# Patient Record
Sex: Female | Born: 1960 | Race: White | Hispanic: No | Marital: Married | Smoking: Current every day smoker
Health system: Southern US, Community
[De-identification: ages and names within clinical notes are randomized; demographics above are authoritative.]

## PROBLEM LIST (undated history)

## (undated) DIAGNOSIS — K589 Irritable bowel syndrome without diarrhea: Secondary | ICD-10-CM

## (undated) DIAGNOSIS — K859 Acute pancreatitis without necrosis or infection, unspecified: Secondary | ICD-10-CM

## (undated) DIAGNOSIS — F329 Major depressive disorder, single episode, unspecified: Secondary | ICD-10-CM

## (undated) DIAGNOSIS — K219 Gastro-esophageal reflux disease without esophagitis: Secondary | ICD-10-CM

## (undated) DIAGNOSIS — R1011 Right upper quadrant pain: Secondary | ICD-10-CM

## (undated) DIAGNOSIS — F32A Depression, unspecified: Secondary | ICD-10-CM

## (undated) DIAGNOSIS — R5383 Other fatigue: Secondary | ICD-10-CM

## (undated) DIAGNOSIS — R634 Abnormal weight loss: Secondary | ICD-10-CM

## (undated) DIAGNOSIS — Z780 Asymptomatic menopausal state: Secondary | ICD-10-CM

## (undated) DIAGNOSIS — I10 Essential (primary) hypertension: Secondary | ICD-10-CM

## (undated) DIAGNOSIS — E7800 Pure hypercholesterolemia, unspecified: Secondary | ICD-10-CM

## (undated) DIAGNOSIS — Z8669 Personal history of other diseases of the nervous system and sense organs: Secondary | ICD-10-CM

## (undated) DIAGNOSIS — R61 Generalized hyperhidrosis: Secondary | ICD-10-CM

## (undated) DIAGNOSIS — F419 Anxiety disorder, unspecified: Secondary | ICD-10-CM

## (undated) DIAGNOSIS — G4700 Insomnia, unspecified: Secondary | ICD-10-CM

## (undated) DIAGNOSIS — J219 Acute bronchiolitis, unspecified: Secondary | ICD-10-CM

## (undated) DIAGNOSIS — E785 Hyperlipidemia, unspecified: Secondary | ICD-10-CM

## (undated) DIAGNOSIS — D649 Anemia, unspecified: Secondary | ICD-10-CM

## (undated) DIAGNOSIS — R1013 Epigastric pain: Secondary | ICD-10-CM

## (undated) HISTORY — DX: Hyperlipidemia, unspecified: E78.5

## (undated) HISTORY — DX: Essential (primary) hypertension: I10

## (undated) HISTORY — PX: WISDOM TOOTH EXTRACTION: SHX21

## (undated) HISTORY — DX: Irritable bowel syndrome without diarrhea: K58.9

## (undated) HISTORY — DX: Major depressive disorder, single episode, unspecified: F32.9

## (undated) HISTORY — DX: Other fatigue: R53.83

## (undated) HISTORY — DX: Asymptomatic menopausal state: Z78.0

## (undated) HISTORY — DX: Right upper quadrant pain: R10.11

## (undated) HISTORY — DX: Epigastric pain: R10.13

## (undated) HISTORY — DX: Anemia, unspecified: D64.9

## (undated) HISTORY — DX: Insomnia, unspecified: G47.00

## (undated) HISTORY — DX: Depression, unspecified: F32.A

## (undated) HISTORY — DX: Anxiety disorder, unspecified: F41.9

## (undated) HISTORY — DX: Pure hypercholesterolemia, unspecified: E78.00

## (undated) HISTORY — DX: Gastro-esophageal reflux disease without esophagitis: K21.9

## (undated) HISTORY — DX: Abnormal weight loss: R63.4

## (undated) HISTORY — DX: Generalized hyperhidrosis: R61

## (undated) HISTORY — DX: Acute pancreatitis without necrosis or infection, unspecified: K85.90

## (undated) HISTORY — DX: Personal history of other diseases of the nervous system and sense organs: Z86.69

## (undated) HISTORY — DX: Acute bronchiolitis, unspecified: J21.9

---

## 2001-06-19 HISTORY — PX: PARTIAL HYSTERECTOMY: SHX80

## 2002-06-19 HISTORY — PX: PARTIAL HYSTERECTOMY: SHX80

## 2003-08-05 ENCOUNTER — Encounter: Admission: RE | Admit: 2003-08-05 | Discharge: 2003-08-05 | Payer: Self-pay | Admitting: Obstetrics and Gynecology

## 2009-01-27 ENCOUNTER — Encounter: Admission: RE | Admit: 2009-01-27 | Discharge: 2009-01-27 | Payer: Self-pay | Admitting: Family Medicine

## 2009-03-25 ENCOUNTER — Other Ambulatory Visit: Admission: RE | Admit: 2009-03-25 | Discharge: 2009-03-25 | Payer: Self-pay | Admitting: Family Medicine

## 2009-04-05 ENCOUNTER — Emergency Department (HOSPITAL_COMMUNITY): Admission: EM | Admit: 2009-04-05 | Discharge: 2009-04-05 | Payer: Self-pay | Admitting: Emergency Medicine

## 2010-06-19 HISTORY — PX: BACK SURGERY: SHX140

## 2010-07-11 ENCOUNTER — Encounter: Payer: Self-pay | Admitting: Family Medicine

## 2011-05-24 ENCOUNTER — Other Ambulatory Visit: Payer: Self-pay | Admitting: Neurosurgery

## 2011-05-24 DIAGNOSIS — M545 Low back pain: Secondary | ICD-10-CM

## 2011-05-26 ENCOUNTER — Ambulatory Visit
Admission: RE | Admit: 2011-05-26 | Discharge: 2011-05-26 | Disposition: A | Discharge status: Home / Self Care | Payer: 59 | Source: Ambulatory Visit | Attending: Neurosurgery | Admitting: Neurosurgery

## 2011-05-26 DIAGNOSIS — M545 Low back pain: Secondary | ICD-10-CM

## 2011-05-26 MED ORDER — GADOBENATE DIMEGLUMINE 529 MG/ML IV SOLN
14.0000 mL | Freq: Once | INTRAVENOUS | Status: AC | PRN
  Administered 2011-05-26: 14 mL via INTRAVENOUS

## 2011-05-29 ENCOUNTER — Other Ambulatory Visit: Payer: Self-pay | Admitting: Neurosurgery

## 2011-05-29 DIAGNOSIS — M5410 Radiculopathy, site unspecified: Secondary | ICD-10-CM

## 2011-06-02 ENCOUNTER — Ambulatory Visit
Admission: RE | Admit: 2011-06-02 | Discharge: 2011-06-02 | Disposition: A | Discharge status: Home / Self Care | Payer: 59 | Source: Ambulatory Visit | Attending: Neurosurgery | Admitting: Neurosurgery

## 2011-06-02 DIAGNOSIS — M5410 Radiculopathy, site unspecified: Secondary | ICD-10-CM

## 2011-06-02 MED ORDER — METHYLPREDNISOLONE ACETATE 40 MG/ML INJ SUSP (RADIOLOG
120.0000 mg | Freq: Once | INTRAMUSCULAR | Status: AC
  Administered 2011-06-02: 120 mg via EPIDURAL

## 2011-06-02 MED ORDER — IOHEXOL 180 MG/ML  SOLN
1.0000 mL | Freq: Once | INTRAMUSCULAR | Status: AC | PRN
  Administered 2011-06-02: 1 mL via EPIDURAL

## 2011-06-02 NOTE — Patient Instructions (Signed)

## 2011-06-14 ENCOUNTER — Other Ambulatory Visit: Payer: Self-pay | Admitting: Neurosurgery

## 2011-06-14 DIAGNOSIS — M5410 Radiculopathy, site unspecified: Secondary | ICD-10-CM

## 2011-06-30 ENCOUNTER — Inpatient Hospital Stay: Admission: RE | Admit: 2011-06-30 | Payer: 59 | Source: Ambulatory Visit

## 2011-07-01 ENCOUNTER — Encounter (HOSPITAL_BASED_OUTPATIENT_CLINIC_OR_DEPARTMENT_OTHER): Payer: Self-pay | Admitting: *Deleted

## 2011-07-01 ENCOUNTER — Emergency Department (HOSPITAL_BASED_OUTPATIENT_CLINIC_OR_DEPARTMENT_OTHER)
Admission: EM | Admit: 2011-07-01 | Discharge: 2011-07-01 | Disposition: A | Discharge status: Home / Self Care | Payer: 59 | Attending: Emergency Medicine | Admitting: Emergency Medicine

## 2011-07-01 DIAGNOSIS — Y9229 Other specified public building as the place of occurrence of the external cause: Secondary | ICD-10-CM | POA: Insufficient documentation

## 2011-07-01 DIAGNOSIS — F17200 Nicotine dependence, unspecified, uncomplicated: Secondary | ICD-10-CM | POA: Insufficient documentation

## 2011-07-01 DIAGNOSIS — S0990XA Unspecified injury of head, initial encounter: Secondary | ICD-10-CM | POA: Insufficient documentation

## 2011-07-01 DIAGNOSIS — W1809XA Striking against other object with subsequent fall, initial encounter: Secondary | ICD-10-CM | POA: Insufficient documentation

## 2011-07-01 MED ORDER — OXYCODONE-ACETAMINOPHEN 5-325 MG PO TABS: 2.0000 | ORAL_TABLET | ORAL | Status: AC | PRN

## 2011-07-01 NOTE — ED Provider Notes (Signed)
History     CSN: [id number]  Arrival date & time 07/01/11  1557   First MD Initiated Contact with Patient 07/01/11 1752      Chief Complaint  Patient presents with  . Fall    (Consider location/radiation/quality/duration/timing/severity/associated sxs/prior treatment) HPI Comments: Pt states that she fell a week ago and hit her head on a table at a restaurant:pt states that she was seen that day and given hydrocodone:pt was then seen by her pcp this week and given ibuprofen and a head ct which was normal:pt states that the only thing that helps when she has pain like this is percocet  Patient is a 51 y.o. female presenting with head injury. The history is provided by the patient. No language interpreter was used.  Head Injury  The incident occurred more than 2 days ago. She came to the ER via walk-in. The injury mechanism was a direct blow. There was no loss of consciousness. There was no blood loss. The quality of the pain is described as throbbing. The pain is moderate. The pain has been constant since the injury. Pertinent negatives include no numbness, no blurred vision, no vomiting, no disorientation, no weakness and no memory loss.    History reviewed. No pertinent past medical history.  Past Surgical History  Procedure Date  . Back surgery   . Abdominal hysterectomy     History reviewed. No pertinent family history.  History  Substance Use Topics  . Smoking status: Current Everyday Smoker  . Smokeless tobacco: Not on file  . Alcohol Use: No    OB History    Grav Para Term Preterm Abortions TAB SAB Ect Mult Living                  Review of Systems  Eyes: Negative for blurred vision.  Gastrointestinal: Negative for vomiting.  Neurological: Negative for weakness and numbness.  Psychiatric/Behavioral: Negative for memory loss.  All other systems reviewed and are negative.    Allergies  Iohexol  Home Medications   Current Outpatient Rx  Name Route Sig  Dispense Refill  . GOODY HEADACHE PO Oral Take 1 packet by mouth 2 (two) times daily as needed. For headache    . IBUPROFEN 800 MG PO TABS Oral Take 800 mg by mouth every 8 (eight) hours as needed. For headache    . OXYCODONE-ACETAMINOPHEN 5-325 MG PO TABS Oral Take 2 tablets by mouth every 4 (four) hours as needed for pain. 6 tablet 0    BP 144/101  Pulse 70  Temp(Src) 98.3 F (36.8 C) (Oral)  Resp 20  Ht 5\' 4"  (1.626 m)  Wt 158 lb (71.668 kg)  BMI 27.12 kg/m2  SpO2 100%  LMP 07/01/2011  Physical Exam  Nursing note and vitals reviewed. Constitutional: She is oriented to person, place, and time. She appears well-developed and well-nourished.  HENT:  Head: Normocephalic and atraumatic.  Eyes: Conjunctivae are normal. Pupils are equal, round, and reactive to light.  Neck: Normal range of motion. Neck supple.  Cardiovascular: Normal rate and regular rhythm.   Pulmonary/Chest: Effort normal and breath sounds normal.  Abdominal: Soft. Bowel sounds are normal.  Musculoskeletal: Normal range of motion.  Neurological: She is alert and oriented to person, place, and time.  Skin: Skin is warm and dry.  Psychiatric: She has a normal mood and affect.    ED Course  Procedures (including critical care time)  Labs Reviewed - No data to display No results found.  1. Head injury       MDM  Pt was extremely agitated on exam when we were discussing pain management:pt states that she doesn't understand why I can just give percocet when she know that will work        Teressa Lower, NP 07/01/11 2046

## 2011-07-01 NOTE — ED Notes (Signed)
Pt states she fell about a week ago and hit her head. Seen at ED Sunday. Sent home with Vicodin. Saw family Dr. On [place]. Given Motrin. CT yesterday at [facility]. Has had H/A , N,V since. PERL.

## 2011-07-02 NOTE — ED Provider Notes (Signed)
Medical screening examination/treatment/procedure(s) were performed by non-physician practitioner and as supervising physician I was immediately available for consultation/collaboration.  Connery Shiffler, MD 07/02/11 0003 

## 2011-07-07 ENCOUNTER — Other Ambulatory Visit: Payer: 59

## 2011-07-14 ENCOUNTER — Ambulatory Visit
Admission: RE | Admit: 2011-07-14 | Discharge: 2011-07-14 | Disposition: A | Discharge status: Home / Self Care | Payer: 59 | Source: Ambulatory Visit | Attending: Neurosurgery | Admitting: Neurosurgery

## 2011-07-14 DIAGNOSIS — M5410 Radiculopathy, site unspecified: Secondary | ICD-10-CM

## 2011-07-14 MED ORDER — METHYLPREDNISOLONE ACETATE 40 MG/ML INJ SUSP (RADIOLOG
120.0000 mg | Freq: Once | INTRAMUSCULAR | Status: AC
  Administered 2011-07-14: 120 mg via EPIDURAL

## 2011-07-14 MED ORDER — IOHEXOL 180 MG/ML  SOLN
1.0000 mL | Freq: Once | INTRAMUSCULAR | Status: AC | PRN
  Administered 2011-07-14: 1 mL via EPIDURAL

## 2011-07-14 NOTE — Progress Notes (Signed)
Patient stated she pre-medicated today with Benadryl 50mg  PO for contrast allergy.  jkl

## 2011-07-20 DIAGNOSIS — M5414 Radiculopathy, thoracic region: Secondary | ICD-10-CM | POA: Insufficient documentation

## 2011-07-20 DIAGNOSIS — M5417 Radiculopathy, lumbosacral region: Secondary | ICD-10-CM

## 2011-08-02 ENCOUNTER — Other Ambulatory Visit: Payer: Self-pay | Admitting: Neurosurgery

## 2011-08-02 DIAGNOSIS — M4800 Spinal stenosis, site unspecified: Secondary | ICD-10-CM

## 2011-12-18 HISTORY — PX: LUMBAR FUSION: SHX111

## 2012-06-19 HISTORY — PX: COLONOSCOPY: SHX174

## 2013-02-26 ENCOUNTER — Other Ambulatory Visit: Payer: Self-pay | Admitting: Orthopaedic Surgery

## 2013-02-26 DIAGNOSIS — M545 Low back pain: Secondary | ICD-10-CM

## 2013-03-07 ENCOUNTER — Inpatient Hospital Stay: Admission: RE | Admit: 2013-03-07 | Payer: 59 | Source: Ambulatory Visit

## 2013-03-07 ENCOUNTER — Inpatient Hospital Stay
Admission: RE | Admit: 2013-03-07 | Discharge: 2013-03-07 | Disposition: A | Discharge status: Home / Self Care | Payer: 59 | Source: Ambulatory Visit | Attending: Orthopaedic Surgery | Admitting: Orthopaedic Surgery

## 2014-02-20 DIAGNOSIS — S46219A Strain of muscle, fascia and tendon of other parts of biceps, unspecified arm, initial encounter: Secondary | ICD-10-CM | POA: Insufficient documentation

## 2014-02-20 DIAGNOSIS — M546 Pain in thoracic spine: Secondary | ICD-10-CM | POA: Insufficient documentation

## 2014-03-21 ENCOUNTER — Ambulatory Visit (INDEPENDENT_AMBULATORY_CARE_PROVIDER_SITE_OTHER): Payer: No Typology Code available for payment source | Admitting: Family Medicine

## 2014-03-21 VITALS — BP 160/92 | HR 75 | Temp 97.8°F | Resp 18 | Ht 64.5 in | Wt 175.6 lb

## 2014-03-21 DIAGNOSIS — Z23 Encounter for immunization: Secondary | ICD-10-CM

## 2014-03-21 DIAGNOSIS — M79642 Pain in left hand: Secondary | ICD-10-CM

## 2014-03-21 DIAGNOSIS — S71102A Unspecified open wound, left thigh, initial encounter: Secondary | ICD-10-CM

## 2014-03-21 DIAGNOSIS — S61402A Unspecified open wound of left hand, initial encounter: Secondary | ICD-10-CM

## 2014-03-21 DIAGNOSIS — S71002A Unspecified open wound, left hip, initial encounter: Secondary | ICD-10-CM

## 2014-03-21 MED ORDER — OXYCODONE-ACETAMINOPHEN 5-325 MG PO TABS
1.0000 | ORAL_TABLET | Freq: Four times a day (QID) | ORAL | Status: DC | PRN
Start: 2014-03-21 — End: 2014-03-30

## 2014-03-21 MED ORDER — TETANUS-DIPHTH-ACELL PERTUSSIS 5-2.5-18.5 LF-MCG/0.5 IM SUSP: 0.5000 mL | Freq: Once | INTRAMUSCULAR | Status: DC

## 2014-03-21 NOTE — Patient Instructions (Signed)

## 2014-03-21 NOTE — Progress Notes (Signed)
VCO. Local anesthesia with 2cc of 1% lidocaine plain. Scrubbed with soap and water. Explored, no deep structure damage observed. Wound closed with #4 5-0 prolene simple interrupted sutures. Cleansed and dressed.

## 2014-03-21 NOTE — Progress Notes (Signed)
Subjective:  This chart was scribed for Amber SidleKurt Jailon Schaible, MD by Carl Bestelina Holson, Medical Scribe. This patient was seen in Room 6 and the patient's care was started at 3:12 PM.   Patient ID: Amber Noble, female    DOB: [birth year], 53 y.o.   MRN: [id number]  HPI HPI Comments: Amber Kunita Dann is a 53 y.o. female who presents to the [facility] complaining of a painful laceration to her right hand with associated numbness and tingling in her right fingers that she incurred today.  She states that the pain radiates to her wrist.  She is not sure if her Tdap is UTD.  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     There are no active problems to display for this patient.  Past Medical History  Diagnosis Date  . Hypertension   . Hyperlipidemia   . Depression   . GERD (gastroesophageal reflux disease)    Past Surgical History  Procedure Laterality Date  . Back surgery    . Abdominal hysterectomy     Allergies  Allergen Reactions  . Contrast Media [Iodinated Diagnostic Agents] Hives and Swelling    Throat  13 hour prep  . Vicodin [Hydrocodone-Acetaminophen] Itching  . Iohexol Rash    Desc: PREMED BENADRYL    Prior to Admission medications   Medication Sig Start Date End Date Taking? Authorizing Provider  ALPRAZolam Prudy Feeler(XANAX) 1 MG tablet Take 1 mg by mouth as needed for anxiety.   Yes Historical Provider, MD  hydrochlorothiazide (HYDRODIURIL) 25 MG tablet Take 25 mg by mouth daily.   Yes Historical Provider, MD  omeprazole (PRILOSEC) 40 MG capsule Take 40 mg by mouth daily.   Yes Historical Provider, MD  PARoxetine (PAXIL) 40 MG tablet Take 40 mg by mouth every morning.   Yes Historical Provider, MD  pravastatin (PRAVACHOL) 40 MG tablet Take 40 mg by mouth daily.   Yes Historical Provider, MD   History   Social History  . Marital Status: Married    Spouse Name: N/A    Number of Children: N/A  . Years of Education: N/A   Occupational History  . Not on  file.   Social History Main Topics  . Smoking status: Current Every Day Smoker  . Smokeless tobacco: Not on file  . Alcohol Use: No  . Drug Use: No  . Sexual Activity: Not on file   Other Topics Concern  . Not on file   Social History Narrative  . No narrative on file     Review of Systems  Musculoskeletal: Positive for arthralgias.  Skin: Positive for wound.  Neurological: Positive for numbness.     Objective:  Physical Exam  Nursing note and vitals reviewed. Constitutional: She is oriented to person, place, and time. She appears well-developed and well-nourished.  HENT:  Head: Normocephalic and atraumatic.  Eyes: EOM are normal.  Neck: Normal range of motion.  Cardiovascular: Normal rate.   Pulmonary/Chest: Effort normal.  Musculoskeletal: Normal range of motion.  Neurological: She is alert and oriented to person, place, and time.  Skin: Skin is warm and dry.  Full thickness 1.5 cm laceration over the MCP joint of her left index finger.  Psychiatric: She has a normal mood and affect. Her behavior is normal.   Numbness and tingling in fingers had resolved by the time patient left the office.  BP 160/92  Pulse 75  Temp(Src) 97.8 F (36.6 C) (Oral)  Resp 18  Ht 5' 4.5" (  1.638 m)  Wt 175 lb 9.6 oz (79.652 kg)  BMI 29.69 kg/m2  SpO2 98%  LMP 07/01/2011 Assessment & Plan:  I personally performed the services described in this documentation, which was scribed in my presence. The recorded information has been reviewed and is accurate. Please see note by physician assistant Wound, open, hand with or without fingers, left, initial encounter - Plan: Tdap (BOOSTRIX) injection 0.5 mL, oxyCODONE-acetaminophen (ROXICET) 5-325 MG per tablet  Pain of left hand - Plan: oxyCODONE-acetaminophen (ROXICET) 5-325 MG per tablet  Signed, Amber Sidle, MD

## 2014-03-24 ENCOUNTER — Telehealth: Payer: Self-pay

## 2014-03-24 NOTE — Telephone Encounter (Signed)
Patient states pharmacy filled hydrocodone as a 10 day supply. States she is running out of the medication and is still in pain. Please return call and advise. Thank you. CB [phone]

## 2014-03-24 NOTE — Telephone Encounter (Signed)
Pt has 3 tablets of Oxycodone left. Says she's been taking them q 6 hrs because she has to use her hand constantly at work and it's very painful. Please advise.

## 2014-03-25 NOTE — Telephone Encounter (Signed)
I cannot refill the oxycodone

## 2014-03-25 NOTE — Telephone Encounter (Signed)
Advised pt to RTC 

## 2014-03-30 ENCOUNTER — Ambulatory Visit (INDEPENDENT_AMBULATORY_CARE_PROVIDER_SITE_OTHER): Payer: No Typology Code available for payment source

## 2014-03-30 ENCOUNTER — Ambulatory Visit (INDEPENDENT_AMBULATORY_CARE_PROVIDER_SITE_OTHER): Payer: No Typology Code available for payment source | Admitting: Internal Medicine

## 2014-03-30 VITALS — BP 172/98 | HR 76 | Temp 99.0°F | Resp 16

## 2014-03-30 DIAGNOSIS — M79642 Pain in left hand: Secondary | ICD-10-CM

## 2014-03-30 DIAGNOSIS — S61402D Unspecified open wound of left hand, subsequent encounter: Secondary | ICD-10-CM

## 2014-03-30 MED ORDER — OXYCODONE-ACETAMINOPHEN 5-325 MG PO TABS: 1.0000 | ORAL_TABLET | Freq: Four times a day (QID) | ORAL | Status: DC | PRN

## 2014-03-30 NOTE — Progress Notes (Signed)
   Subjective:    Patient ID: Amber Noble, female    DOB: Oct 24, 1960, 53 y.o.   MRN: [id number]   PCP: No primary provider on file.  Chief Complaint  Patient presents with  . Suture / Staple Removal    Left hand, continued pain and numbness in hand    Medications, allergies, past medical history, surgical history, family history, social history and problem list reviewed and updated.  HPI  Presents for suture removal from wound of the LEFT hand, closed 03/21/2014. That note is reviewed.  The wound is healing well, and she's had no problems with it, but continues to have terrible pain in the hand that radiates up the forearm and tingling/numbness in all the fingers of the LEFT hand. The pain wakes her from sleep. The paresthesias are on the dorsal aspect of the proximal fingers, only extending the length of the index finger.  She has some swelling of both hands, such that she is unable to remove her rings. The Percocet she was given worked, but she has run out.  RIGHT hand dominant. 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 No fever, chills. No weakness in the hand/fingers.  Review of Systems As above.    Objective:   Physical Exam  Constitutional: She is oriented to person, place, and time. She appears well-developed and well-nourished. She is active and cooperative. No distress.  BP 172/98  Pulse 76  Temp(Src) 99 F (37.2 C) (Oral)  Resp 16  SpO2 100%  LMP 07/01/2011   Eyes: Conjunctivae are normal.  Pulmonary/Chest: Effort normal.  Musculoskeletal:       Left wrist: She exhibits normal range of motion, no tenderness, no bony tenderness and no swelling.       Left hand: She exhibits tenderness (mild, diffuse tenderness of the hand and forearm) and swelling (mild, diffuse swelling of both hands, L>R). She exhibits normal range of motion, no bony tenderness, normal capillary refill  and no deformity. Normal sensation noted. Normal strength noted.       Hands: Phalen's and Tinel's without exacerbation of symptoms.  Neurological: She is alert and oriented to person, place, and time. She has normal strength. No sensory deficit.  Skin: Skin is warm and dry. Laceration (well-healed, without erythema, induration, drainage) noted.  Psychiatric: She has a normal mood and affect. Her speech is normal and behavior is normal.      LEFT Hand: [facility] reading (PRIMARY) by  Dr. Mindi JunkerGottlieb. Normal bony structures of the hand/finger.      Assessment & Plan:  1. Wound, open, hand with or without fingers, left, subsequent encounter Wound is well-healed.   2. Pain of left hand She declines a note for work-cannot afford to be out.  If her pain persists, she'll need to see a hand specialist, and understands that the percocet prescription will not be refilled. She indicates that her insurance does not require a referral to specialists and she'll go ahead an research which one is in her network in the event that her pain does not improve. - DG Hand Complete Left; Future - oxyCODONE-acetaminophen (ROXICET) 5-325 MG per tablet; Take 1 tablet by mouth every 6 (six) hours as needed for severe pain.  Dispense: 30 tablet; Refill: 0   Amber Brashelle S. Niurka Benecke, PA-C Physician Assistant-Certified [facility]

## 2014-03-30 NOTE — Patient Instructions (Addendum)
Do some gentle exercise of the hand with a ball (like a tress ball) twice a day. If your symptoms persist, we'll refer you to a hand specialist.

## 2014-04-15 ENCOUNTER — Other Ambulatory Visit: Payer: Self-pay

## 2014-04-15 DIAGNOSIS — Z1231 Encounter for screening mammogram for malignant neoplasm of breast: Secondary | ICD-10-CM

## 2014-05-05 ENCOUNTER — Ambulatory Visit: Payer: 59

## 2014-06-09 DIAGNOSIS — R269 Unspecified abnormalities of gait and mobility: Secondary | ICD-10-CM | POA: Insufficient documentation

## 2014-06-09 DIAGNOSIS — R2689 Other abnormalities of gait and mobility: Secondary | ICD-10-CM | POA: Insufficient documentation

## 2014-07-13 DIAGNOSIS — H81399 Other peripheral vertigo, unspecified ear: Secondary | ICD-10-CM | POA: Insufficient documentation

## 2014-08-21 HISTORY — PX: UPPER GI ENDOSCOPY: SHX6162

## 2014-09-29 ENCOUNTER — Ambulatory Visit (INDEPENDENT_AMBULATORY_CARE_PROVIDER_SITE_OTHER): Payer: No Typology Code available for payment source

## 2014-09-29 ENCOUNTER — Ambulatory Visit (INDEPENDENT_AMBULATORY_CARE_PROVIDER_SITE_OTHER): Payer: No Typology Code available for payment source | Admitting: Internal Medicine

## 2014-09-29 VITALS — BP 144/88 | HR 71 | Temp 97.7°F | Resp 18 | Ht 64.25 in | Wt 177.4 lb

## 2014-09-29 DIAGNOSIS — M5442 Lumbago with sciatica, left side: Secondary | ICD-10-CM

## 2014-09-29 DIAGNOSIS — S322XXA Fracture of coccyx, initial encounter for closed fracture: Secondary | ICD-10-CM | POA: Diagnosis not present

## 2014-09-29 MED ORDER — CYCLOBENZAPRINE HCL 5 MG PO TABS: 5.0000 mg | ORAL_TABLET | Freq: Three times a day (TID) | ORAL | Status: DC | PRN

## 2014-09-29 MED ORDER — OXYCODONE-ACETAMINOPHEN 5-325 MG PO TABS: 1.0000 | ORAL_TABLET | Freq: Three times a day (TID) | ORAL | Status: DC | PRN

## 2014-09-29 NOTE — Progress Notes (Signed)
Subjective:    Patient ID: Amber Noble, female    DOB: Jul 13, 1960, 54 y.o.   MRN: [id number]  HPI Amber Noble is a 54 year old female who presents with tailbone pain after she fell on a concrete floor 2 days ago. Location of pain is directly over her sacrococcygeal region as well as bilateral lower lumbar region. Hx significant for L4-5 fusion 3 years ago and at least 2 prior episodes of coccygeal fracture. She endorses some radicular lateral left leg pain as well. She denies any alcohol use pertaining to the fall. She was in an office chair, when one of the wheels was missing, causing the chair to slide out from under her in the garage. Symptoms are aggravated with sitting, twisting, or bending. Tried heating pad, ice, topical pain patch, and tramadol with little relief. Her pain wakes her from sleep. The radicular pain is mainly a sharp burning, without significant weakness or numbness. She denies any loss of bladder or bowel or saddle anesthesia. She also takes meloxicam, though says that she cannot take other anti-inflammatories due to a history of peptic ulcer disease.  History is significant for 2 prior low back surgeries. The first was a lumbar laminectomy and the second was 3 years ago with Dr. Noel Geroldohen at the spine center, which was an L4-5 lumbar fusion. She is currently looking for a local orthopedic spine surgeon, as she recently moved to this area since her surgeries.  Past medical history, social history, medications, and allergies were reviewed and are up to date in the chart.  Review of Systems 7 point review of systems was performed and was otherwise negative unless noted in the history of present illness.     Objective:   Physical Exam BP 144/88 mmHg  Pulse 71  Temp(Src) 97.7 F (36.5 C) (Oral)  Resp 18  Ht 5' 4.25" (1.632 m)  Wt 177 lb 6.4 oz (80.468 kg)  BMI 30.21 kg/m2  SpO2 96%  LMP 07/01/2011 GEN: The patient is well-developed well-nourished female and in no acute  distress.  She is awake alert and oriented x3. SKIN: warm and well-perfused, no rash  EXTR: No lower extremity edema or calf tenderness Neuro: Strength 5/5 globally. Sensation intact throughout. DTRs 2/4 bilaterally. No focal deficits. Vasc: +2 bilateral distal pulses. No edema.  MSK: Examination of the lumbar spine reveals a well-healed midline incision around the L4-S1 level. She has bilateral midline and paraspinal tenderness to palpation around L4-S1. She has full internal and external rotation of the bilateral hips without pain. Her strength is intact in the bilateral lower extremities as well as sensation intact throughout. Positive seated straight leg raise test on the left. No tenderness at the SI joints. Positive Faber test.  X-rays Lumbar spine and Coccyx: Preliminary read by Dr. Joellyn HaffPick-Jacobs, DO: Previous lumbar fusion hardware appears to be in good position and alignment without signs of loosening. Unable to fully assess whether minimally displaced coccygeal fracture is acute or chronic. Fairly well-preserved disc heights with mild degenerative disc disease at L3-4      Assessment & Plan:  1. Axial lumbar low back pain in the setting of a history of prior spinal fusion surgery and recent traumatic fall. 2. Left-sided lumbar radiculopathy in a slightly nonspecific nerve distribution, though most resembles L5. 3. Acute vs. Chronic coccygeal fracture seen on x-ray.  -No red flags on exam today. -Lumbar and coccygeal x-rays were performed today. Above findings,  -Prescribed short course of Percocet 5/25 mg every 6-8 hours as  needed for pain with precautions. -We discussed adding a prednisone Dosepak, but the patient says that her peptic ulcer disease would likely be exacerbated given her history. -Prescribed short course of cyclobenzaprine with precautions for low back spasm. -Continue heat, ice, gentle stretching, limit twisting and bending for now. -Referral placed for new orthopedic  spine surgeon for follow-up, as if her symptoms persist a CT of the lumbar spine would be indicated to look for any signs of loosening hardware or myelopathy. -She may follow-up with [place] on an as-needed basis or go immediately to the ED. We discussed reasons to return including worsening pain, numbness in her groin, loss of bladder or bowel, or percussibly worsening weakness, numbness, or for any other concerns. She verbalized understanding and agreement with this plan.  Dr. Joellyn Haff, DO Sports Medicine Fellow  Discussed with Dr. Merla Riches.

## 2014-09-29 NOTE — Patient Instructions (Signed)
Plan follow-up with local Orthopedic group within the next couple weeks for re-evaluation.  Back Pain, Adult Low back pain is very common. About 1 in 5 people have back pain.The cause of low back pain is rarely dangerous. The pain often gets better over time.About half of people with a sudden onset of back pain feel better in just 2 weeks. About 8 in 10 people feel better by 6 weeks.  CAUSES Some common causes of back pain include:  Strain of the muscles or ligaments supporting the spine.  Wear and tear (degeneration) of the spinal discs.  Arthritis.  Direct injury to the back. DIAGNOSIS Most of the time, the direct cause of low back pain is not known.However, back pain can be treated effectively even when the exact cause of the pain is unknown.Answering your caregiver's questions about your overall health and symptoms is one of the most accurate ways to make sure the cause of your pain is not dangerous. If your caregiver needs more information, he or she may order lab work or imaging tests (X-rays or MRIs).However, even if imaging tests show changes in your back, this usually does not require surgery. HOME CARE INSTRUCTIONS For many people, back pain returns.Since low back pain is rarely dangerous, it is often a condition that people can learn to [facility] their own.   Remain active. It is stressful on the back to sit or stand in one place. Do not sit, drive, or stand in one place for more than 30 minutes at a time. Take short walks on level surfaces as soon as pain allows.Try to increase the length of time you walk each day.  Do not stay in bed.Resting more than 1 or 2 days can delay your recovery.  Do not avoid exercise or work.Your body is made to move.It is not dangerous to be active, even though your back may hurt.Your back will likely heal faster if you return to being active before your pain is gone.  Pay attention to your body when you bend and lift. Many people have  less discomfortwhen lifting if they bend their knees, keep the load close to their bodies,and avoid twisting. Often, the most comfortable positions are those that put less stress on your recovering back.  Find a comfortable position to sleep. Use a firm mattress and lie on your side with your knees slightly bent. If you lie on your back, put a pillow under your knees.  Only take over-the-counter or prescription medicines as directed by your caregiver. Over-the-counter medicines to reduce pain and inflammation are often the most helpful.Your caregiver may prescribe muscle relaxant drugs.These medicines help dull your pain so you can more quickly return to your normal activities and healthy exercise.  Put ice on the injured area.  Put ice in a plastic bag.  Place a towel between your skin and the bag.  Leave the ice on for 15-20 minutes, 03-04 times a day for the first 2 to 3 days. After that, ice and heat may be alternated to reduce pain and spasms.  Ask your caregiver about trying back exercises and gentle massage. This may be of some benefit.  Avoid feeling anxious or stressed.Stress increases muscle tension and can worsen back pain.It is important to recognize when you are anxious or stressed and learn ways to manage it.Exercise is a great option. SEEK MEDICAL CARE IF:  You have pain that is not relieved with rest or medicine.  You have pain that does not improve in 1  week.  You have new symptoms.  You are generally not feeling well. SEEK IMMEDIATE MEDICAL CARE IF:   You have pain that radiates from your back into your legs.  You develop new bowel or bladder control problems.  You have unusual weakness or numbness in your arms or legs.  You develop nausea or vomiting.  You develop abdominal pain.  You feel faint. Document Released: 06/05/2005 Document Revised: 12/05/2011 Document Reviewed: 10/07/2013 [facility] Patient Information 2015 [employer] [place]. This information  is not intended to replace advice given to you by your health care provider. Make sure you discuss any questions you have with your health care provider.

## 2014-10-06 ENCOUNTER — Telehealth: Payer: Self-pay

## 2014-10-06 NOTE — Telephone Encounter (Signed)
PATIENT STATES SHE WAS IN THE OFFICE A WEEK AGO FOR BACK PAIN AND A FRACTURE TAIL BONE. SHE IS WAITING ON A REFERRAL TO A SPECIALIST, BUT IN THE MEAN TIME SHE NEEDS TO GET A REFILL ON HER OXYCODONE. SHE IS STILL IN A LOT OF PAIN. PLEASE CALL HER WHEN SHE CAN PICK THE PRESCRIPTION UP. BEST PHONE [phone] (CELL) MBC

## 2014-10-07 ENCOUNTER — Other Ambulatory Visit: Payer: Self-pay | Admitting: Internal Medicine

## 2014-10-07 DIAGNOSIS — M533 Sacrococcygeal disorders, not elsewhere classified: Secondary | ICD-10-CM

## 2014-10-07 MED ORDER — OXYCODONE-ACETAMINOPHEN 5-325 MG PO TABS: 1.0000 | ORAL_TABLET | Freq: Three times a day (TID) | ORAL | Status: DC | PRN

## 2014-10-07 NOTE — Telephone Encounter (Signed)
If the meds didn't work we'll set up ortho and refill meds --oxy

## 2014-10-08 NOTE — Telephone Encounter (Signed)
Spoke with pt, advised Rx ready to pick up. 

## 2014-10-21 ENCOUNTER — Other Ambulatory Visit: Payer: Self-pay | Admitting: Family Medicine

## 2014-10-21 ENCOUNTER — Encounter: Payer: Self-pay | Admitting: Family Medicine

## 2014-10-22 ENCOUNTER — Encounter: Payer: Self-pay | Admitting: Family Medicine

## 2014-10-22 ENCOUNTER — Ambulatory Visit (INDEPENDENT_AMBULATORY_CARE_PROVIDER_SITE_OTHER): Payer: No Typology Code available for payment source | Admitting: Family Medicine

## 2014-10-22 VITALS — BP 140/92 | HR 107 | Wt 176.0 lb

## 2014-10-22 DIAGNOSIS — M5416 Radiculopathy, lumbar region: Secondary | ICD-10-CM | POA: Diagnosis not present

## 2014-10-22 MED ORDER — TRAMADOL HCL 50 MG PO TABS
50.0000 mg | ORAL_TABLET | Freq: Two times a day (BID) | ORAL | Status: DC | PRN
Start: 2014-10-22 — End: 2014-12-17

## 2014-10-22 MED ORDER — GABAPENTIN 100 MG PO CAPS: 200.0000 mg | ORAL_CAPSULE | Freq: Every day | ORAL | Status: DC

## 2014-10-22 NOTE — Assessment & Plan Note (Signed)
Believe the patient is having more of a lumbar radiculopathy. I do think the patient is having some nerve root impingement on the first nerve but no weakness versus continued numbness. Patient has responded before to gabapentin patient given up her prescription for this again. We'll slowly titrate up to 300 mg at night. Discussed with patient and limited pain medications and we did even bring up that patient has had this multiple times. Patient then will come back after doing the home exercises, and other conservative therapy 2-3 weeks to make sure she responding.

## 2014-10-22 NOTE — Progress Notes (Signed)
Amber Noble D.O. [facility] [address] [place], [place] [address] Phone: [phone] Subjective:    I'm seeing this patient by the request  of:  Dr. Merla Richesoolittle  CC: Low back pain with left-sided sciatica  UXL:[id number]:Subjective Amber Noble is a 54 y.o. female coming in with complaint of low back pain. Patient states that most of this seems to be left-sided. Patient has been seen over the course of multiple years for this problem. Patient was most recently seen by Dr. Merla Richesoolittle in April. Patient had new x-rays that did show some chronic coccygeal fracture that has been seen on previous x-rays. These were reviewed by me. No significant changes noted. Patient does have a past medical history significant for an L4-L5 posterior fusion. Patient unfortunately has fallen multiple times and was concern that she is going to move some of the bracing that she has in there. Patient continues to have radicular symptoms going down the posterior aspect of the leg.  Patient states that this symptoms affect her daily activities. 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 Patient continues to try other home modalities such as heating pad, ice, topical pain patches, tramadol area patient states that only Percocet and a medicine she cannot remember used to work. Denies any weakness of the leg denies any constant numbness.  Past Medical History  Diagnosis Date  . Hypertension   . Hyperlipidemia   . Depression   . GERD (gastroesophageal reflux disease)    Past Surgical History  Procedure Laterality Date  . Back surgery    . Abdominal hysterectomy     Allergies  Allergen Reactions  . Contrast Media [Iodinated Diagnostic Agents] Hives and Swelling    Throat  13 hour prep  . Vicodin [Hydrocodone-Acetaminophen] Itching  . Iohexol Rash    Desc: PREMED BENADRYL    Family History  Problem Relation Age of Onset  . Hypertension  Mother    History  Substance Use Topics  . Smoking status: Current Every Day Smoker  . Smokeless tobacco: Not on file  . Alcohol Use: No       Past medical history, social, surgical and family history all reviewed in electronic medical record.   Review of Systems: No headache, visual changes, nausea, vomiting, diarrhea, constipation, dizziness, abdominal pain, skin rash, fevers, chills, night sweats, weight loss, swollen lymph nodes, body aches, joint swelling, muscle aches, chest pain, shortness of breath, mood changes.   Objective Blood pressure 140/92, pulse 107, weight 176 lb (79.833 kg), last menstrual period 07/01/2011, SpO2 97 %.  General: No apparent distress alert and oriented x3 mood and affect normal, dressed appropriately.  HEENT: Pupils equal, extraocular movements intact  Respiratory: Patient's speak in full sentences and does not appear short of breath  Cardiovascular: No lower extremity edema, non tender, no erythema  Skin: Warm dry intact with no signs of infection or rash on extremities or on axial skeleton.  Abdomen: Soft nontender  Neuro: Cranial nerves II through XII are intact, neurovascularly intact in all extremities with 2+ DTRs and 2+ pulses.  Lymph: No lymphadenopathy of posterior or anterior cervical chain or axillae bilaterally.  Gait normal with good balance and coordination.  MSK:  Non tender with full range of motion and good stability and symmetric strength and tone of shoulders, elbows, wrist, hip, knee and ankles bilaterally.  Back Exam:  Inspection: incision well-healed Motion: Flexion 30 deg, Extension  35 deg, Side Bending to 35 deg bilaterally,  Rotation to 35 deg bilaterally  SLR laying: positive left side XSLR laying: Negative  Palpable tenderness: patient does have some mild tenderness to palpation over the left-sided paraspinal musculature as well as the left SI joint FABER: positive left side Sensory change: Gross sensation intact to all  lumbar and sacral dermatomes.  Reflexes: 2+ at both patellar tendons, 2+ at achilles tendons, Babinski's downgoing.  Strength at foot  Plantar-flexion: 5/5 Dorsi-flexion: 5/5 Eversion: 5/5 Inversion: 5/5  Leg strength  Quad: 5/5 Hamstring: 5/5 Hip flexor: 4/5 Hip abductors: 4/5  Gait unremarkable.   Procedure note 97110; 15 minutes spent for Therapeutic exercises as stated in above notes.  This included exercises focusing on stretching, strengthening, with significant focus on eccentric aspects.  Low back exercises that included:  Pelvic tilt/bracing instruction to focus on control of the pelvic girdle and lower abdominal muscles  Glute strengthening exercises, focusing on proper firing of the glutes without engaging the low back muscles Proper stretching techniques for maximum relief for the hamstrings, hip flexors, low back and some rotation where tolerated Proper technique shown and discussed handout in great detail with ATC.  All questions were discussed and answered.      Impression and Recommendations:     This case required medical decision making of moderate complexity.

## 2014-10-22 NOTE — Patient Instructions (Signed)
Good to see you Ice 20 minutes 2 times daily. Usually after activity and before bed. Exercises 3 times a week.  Vitamin D 2000 IU daily Turmeric 500mg  twice daily Fish oil 2 grams daily Try the pennsaid twice daily Gabapentin 100mg  nightly for first week then 200mg  nightly 2nd week and 300mg  nightly thereafter.  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

## 2014-10-22 NOTE — Progress Notes (Signed)
Pre visit review using our clinic review tool, if applicable. No additional management support is needed unless otherwise documented below in the visit note. 

## 2014-11-13 ENCOUNTER — Encounter: Payer: Self-pay | Admitting: Family Medicine

## 2014-11-13 ENCOUNTER — Ambulatory Visit (INDEPENDENT_AMBULATORY_CARE_PROVIDER_SITE_OTHER): Payer: No Typology Code available for payment source | Admitting: Family Medicine

## 2014-11-13 VITALS — BP 124/84 | HR 91 | Ht 64.25 in | Wt 176.0 lb

## 2014-11-13 DIAGNOSIS — M5416 Radiculopathy, lumbar region: Secondary | ICD-10-CM | POA: Diagnosis not present

## 2014-11-13 MED ORDER — GABAPENTIN 300 MG PO CAPS: ORAL_CAPSULE | ORAL | Status: DC

## 2014-11-13 MED ORDER — GABAPENTIN 100 MG PO CAPS: 100.0000 mg | ORAL_CAPSULE | Freq: Two times a day (BID) | ORAL | Status: DC

## 2014-11-13 NOTE — Progress Notes (Signed)
Pre visit review using our clinic review tool, if applicable. No additional management support is needed unless otherwise documented below in the visit note. 

## 2014-11-13 NOTE — Progress Notes (Signed)
[name] [address] [place], [place] [address] Phone: [phone] Subjective:    I'm seeing this patient by the request  of:  Dr. Merla Riches  CC: Low back pain with left-sided sciatica  BJY:[id number] Amber Noble is a 54 y.o. female coming in with complaint of low back pain. Patient states that most of this seems to be left-sided. Patient has been seen over the course of multiple years for this problem. Patient was most recently seen by Dr. Merla Riches in April. Patient had new x-rays that did show some chronic coccygeal fracture that has been seen on previous x-rays. These were reviewed by me. No significant changes noted. Patient does have a past medical history significant for an L4-L5 posterior fusion.   She was seen previously and continued to have a lumbar radiculopathy.. Patient states that the radicular symptoms is somewhat better. Patient is now at gabapentin 300 mg at night. The pain is not stopping her from any activity and she is sleeping better at night. Patient denies any weakness or any continuous numbness. Patient states though that he can be very painful from time to time. Patient denies any intractable pain and continues to be able to do her daily activities which is an improvement from previous exam.  Past Medical History  Diagnosis Date  . Hypertension   . Hyperlipidemia   . Depression   . GERD (gastroesophageal reflux disease)    Past Surgical History  Procedure Laterality Date  . Back surgery    . Abdominal hysterectomy     Allergies  Allergen Reactions  . Contrast Media [Iodinated Diagnostic Agents] Hives and Swelling    Throat  13 hour prep  . Vicodin [Hydrocodone-Acetaminophen] Itching  . Iohexol Rash    Desc: PREMED BENADRYL    Family History  Problem Relation Age of Onset  . Hypertension Mother    History  Substance Use Topics  . Smoking status: Current Every Day Smoker  . Smokeless tobacco: Not on file  . Alcohol  Use: No       Past medical history, social, surgical and family history all reviewed in electronic medical record.   Review of Systems: No headache, visual changes, nausea, vomiting, diarrhea, constipation, dizziness, abdominal pain, skin rash, fevers, chills, night sweats, weight loss, swollen lymph nodes, body aches, joint swelling, muscle aches, chest pain, shortness of breath, mood changes.   Objective Blood pressure 124/84, pulse 91, height 5' 4.25" (1.632 m), weight 176 lb (79.833 kg), last menstrual period 07/01/2011, SpO2 99 %.  General: No apparent distress alert and oriented x3 mood and affect normal, dressed appropriately.  HEENT: Pupils equal, extraocular movements intact  Respiratory: Patient's speak in full sentences and does not appear short of breath  Cardiovascular: No lower extremity edema, non tender, no erythema  Skin: Warm dry intact with no signs of infection or rash on extremities or on axial skeleton.  Abdomen: Soft nontender  Neuro: Cranial nerves II through XII are intact, neurovascularly intact in all extremities with 2+ DTRs and 2+ pulses.  Lymph: No lymphadenopathy of posterior or anterior cervical chain or axillae bilaterally.  Gait normal with good balance and coordination.  MSK:  Non tender with full range of motion and good stability and symmetric strength and tone of shoulders, elbows, wrist, hip, knee and ankles bilaterally.  Back Exam:  Inspection: incision well-healed Motion: Flexion 40 deg, Extension 35 deg, Side Bending to 35 deg bilaterally, mild increase in range of motion from previous exam  Rotation to 35 deg bilaterally  SLR laying: positive left side still present XSLR laying: Negative  Palpable tenderness: Less tender than last exam but still some mild right and left radicular symptoms. FABER: positive left side Sensory change: Gross sensation intact to all lumbar and sacral dermatomes.  Reflexes: 2+ at both patellar tendons, 2+ at achilles  tendons, Babinski's downgoing.  Strength at foot  Plantar-flexion: 5/5 Dorsi-flexion: 5/5 Eversion: 5/5 Inversion: 5/5  Leg strength  Quad: 5/5 Hamstring: 5/5 Hip flexor: 4/5 Hip abductors: 4/5  Gait unremarkable.        Impression and Recommendations:     This case required medical decision making of moderate complexity.

## 2014-11-13 NOTE — Patient Instructions (Signed)
Good to see you Ice is your friend COnitnue the exercises at least 3 times a week Stay active For ankle try little exercises Gabapentin 100mg  in AM, 100mg  in PM and 300mg  at night See me again in 4 weeks or call sooner if numbness or weakness occur

## 2014-11-13 NOTE — Assessment & Plan Note (Addendum)
Patient is making some improvement. We discussed over-the-counter natural supplementations and patient given another per prescription for gabapentin. We will increase her to 100 mg twice daily in 300 mg at night. We discussed the importance of range of motion exercises and core strengthening which patient will try to continue to do. We discussed proper shoe wear. Patient will make these changes and come back and see me again in 4 weeks for further evaluation and treatment. Patient knows to call me if any worsening symptoms occurs and we'll consider advanced imaging secondary to patient's fusion we will consider a CT scan.  Spent  25 minutes with patient face-to-face and had greater than 50% of counseling including as described above in assessment and plan.

## 2014-12-07 ENCOUNTER — Ambulatory Visit (INDEPENDENT_AMBULATORY_CARE_PROVIDER_SITE_OTHER): Payer: No Typology Code available for payment source

## 2014-12-07 ENCOUNTER — Ambulatory Visit (INDEPENDENT_AMBULATORY_CARE_PROVIDER_SITE_OTHER): Payer: No Typology Code available for payment source | Admitting: Emergency Medicine

## 2014-12-07 VITALS — BP 152/97 | HR 97 | Temp 97.6°F | Resp 18

## 2014-12-07 DIAGNOSIS — S80812A Abrasion, left lower leg, initial encounter: Secondary | ICD-10-CM

## 2014-12-07 DIAGNOSIS — M25552 Pain in left hip: Secondary | ICD-10-CM

## 2014-12-07 DIAGNOSIS — S81811A Laceration without foreign body, right lower leg, initial encounter: Secondary | ICD-10-CM

## 2014-12-07 MED ORDER — NAPROXEN SODIUM 550 MG PO TABS: 550.0000 mg | ORAL_TABLET | Freq: Two times a day (BID) | ORAL | Status: AC

## 2014-12-07 MED ORDER — AMOXICILLIN-POT CLAVULANATE 875-125 MG PO TABS: 1.0000 | ORAL_TABLET | Freq: Two times a day (BID) | ORAL | Status: DC

## 2014-12-07 NOTE — Patient Instructions (Signed)
WOUND CARE Please return in 2 days to have your stitches/staples removed or sooner if you have concerns. . Keep area clean and dry for 24 hours. Do not remove bandage, if applied. . After 24 hours, remove bandage and wash wound gently with mild soap and warm water. Reapply a new bandage after cleaning wound, if directed. . Continue daily cleansing with soap and water until stitches/staples are removed. . Do not apply any ointments or creams to the wound while stitches/staples are in place, as this may cause delayed healing. . Notify the office if you experience any of the following signs of infection: Swelling, redness, pus drainage, streaking, fever >101.0 F . Notify the office if you experience excessive bleeding that does not stop after 15-20 minutes of constant, firm pressure.  \ 

## 2014-12-07 NOTE — Progress Notes (Signed)
Subjective:  Patient ID: Amber Noble, female    DOB: June 01, 1961  Age: 54 y.o. MRN: [id number]  CC: Animal Bite   HPI Amber Noble presents  she was bitten by a neighbor's dog this morning. The attack by the dog caused her fall abrading her left calf and she has pain on her left hip were from falling. She is current on tetanus. The dog is current on rabies. Pain in the hip is increased with ambulation. She denies any other injuries related to the fall. He has no radiation of pain numbness tingling or weakness in her leg. She is able to bear weight.  History Amber Noble has a past medical history of Hypertension; Hyperlipidemia; Depression; and GERD (gastroesophageal reflux disease).   She has past surgical history that includes Back surgery and Abdominal hysterectomy.   Her  family history includes Hypertension in her mother.  She   reports that she has been smoking.  She does not have any smokeless tobacco history on file. She reports that she does not drink alcohol or use illicit drugs.  Outpatient Prescriptions Prior to Visit  Medication Sig Dispense Refill  . ALPRAZolam (XANAX) 1 MG tablet Take 1 mg by mouth as needed for anxiety.    . gabapentin (NEURONTIN) 100 MG capsule Take 1 capsule (100 mg total) by mouth 2 (two) times daily. 180 capsule 3  . gabapentin (NEURONTIN) 300 MG capsule 1 pill nightly 30 capsule 3  . hydrochlorothiazide (HYDRODIURIL) 25 MG tablet Take 25 mg by mouth daily.    Amber Kitchen omeprazole (PRILOSEC) 40 MG capsule Take 40 mg by mouth daily.    Amber Kitchen PARoxetine (PAXIL) 40 MG tablet Take 40 mg by mouth every morning.    . pravastatin (PRAVACHOL) 40 MG tablet Take 40 mg by mouth daily.    . traMADol (ULTRAM) 50 MG tablet Take 1 tablet (50 mg total) by mouth every 12 (twelve) hours as needed. 60 tablet 0   No facility-administered medications prior to visit.    History   Social History  . Marital Status: Married    Spouse Name: N/A  . Number of Children: N/A  . Years  of Education: N/A   Social History Main Topics  . Smoking status: Current Every Day Smoker  . Smokeless tobacco: Not on file  . Alcohol Use: No  . Drug Use: No  . Sexual Activity: Not on file   Other Topics Concern  . None   Social History Narrative     Review of Systems  Constitutional: Negative for fever, chills and appetite change.  HENT: Negative for congestion, ear pain, postnasal drip, sinus pressure and sore throat.   Eyes: Negative for pain and redness.  Respiratory: Negative for cough, shortness of breath and wheezing.   Cardiovascular: Negative for leg swelling.  Gastrointestinal: Negative for nausea, vomiting, abdominal pain, diarrhea, constipation and blood in stool.  Endocrine: Negative for polyuria.  Genitourinary: Negative for dysuria, urgency, frequency and flank pain.  Musculoskeletal: Positive for arthralgias (Left hip pain. Increases with ambulation.). Negative for gait problem.  Skin: Positive for wound (right calf from a dog bite. Abrasion left calf.). Negative for rash.  Neurological: Negative for weakness and headaches.  Psychiatric/Behavioral: Negative for confusion and decreased concentration. The patient is not nervous/anxious.     Objective:  BP 152/97 mmHg  Pulse 97  Temp(Src) 97.6 F (36.4 C) (Oral)  Resp 18  SpO2 97%  LMP 07/01/2011  Physical Exam  Constitutional: She is oriented to person, place, and  time. She appears well-developed and well-nourished.  HENT:  Head: Normocephalic and atraumatic.  Eyes: Conjunctivae are normal. Pupils are equal, round, and reactive to light.  Pulmonary/Chest: Effort normal.  Musculoskeletal: She exhibits no edema.       Left hip: She exhibits decreased range of motion and tenderness. She exhibits no deformity.  Neurological: She is alert and oriented to person, place, and time.  Skin: Skin is dry. Laceration (There are multiple superficial lacerations of right lateral calf.. There is no foreign body. No  nerve artery or tendon injury. She has multiple abrasions on her left calf.) noted.  Psychiatric: She has a normal mood and affect. Her behavior is normal. Thought content normal.      Assessment & Plan:   Amber Noble was seen today for animal bite.  Diagnoses and all orders for this visit:  Left hip pain Orders: -     DG HIP UNILAT WITH PELVIS 2-3 VIEWS LEFT; Future  Laceration of right leg excluding thigh, initial encounter Orders: -     DG HIP UNILAT WITH PELVIS 2-3 VIEWS LEFT; Future  Abrasion of left leg, initial encounter Orders: -     DG HIP UNILAT WITH PELVIS 2-3 VIEWS LEFT; Future  Other orders -     amoxicillin-clavulanate (AUGMENTIN) 875-125 MG per tablet; Take 1 tablet by mouth 2 (two) times daily. -     naproxen sodium (ANAPROX DS) 550 MG tablet; Take 1 tablet (550 mg total) by mouth 2 (two) times daily with a meal.   I am having Ms. Noble start on amoxicillin-clavulanate and naproxen sodium. I am also having her maintain her hydrochlorothiazide, pravastatin, PARoxetine, ALPRAZolam, omeprazole, traMADol, gabapentin, and gabapentin.  Meds ordered this encounter  Medications  . amoxicillin-clavulanate (AUGMENTIN) 875-125 MG per tablet    Sig: Take 1 tablet by mouth 2 (two) times daily.    Dispense:  20 tablet    Refill:  0  . naproxen sodium (ANAPROX DS) 550 MG tablet    Sig: Take 1 tablet (550 mg total) by mouth 2 (two) times daily with a meal.    Dispense:  40 tablet    Refill:  0   Her wound was sutured by Deliah Boston we discussed the risk of infection rabies tetanus and management for left hip contusion and she was in agreement with the plan. She'll follow-up as indicated  Appropriate red flag conditions were discussed with the patient as well as actions that should be taken.  Patient expressed his understanding.  Follow-up: No Follow-up on file.  Carmelina Dane, MD   [facility] reading (PRIMARY) by  Dr. Dareen Piano negative hip.Amber Kitchen

## 2014-12-07 NOTE — Progress Notes (Signed)
Procedure: Risk and benefits discussed and verbal consent obtained. The patient was anesthetized using 7 cc of 2% lidocaine with epinephrine. The wound was scrubbed with soap and water. Sterile prep and drape. The wound was closed with 4-0 Prolene.  The wound was then cleaned with water and a bandage was applied. Patient instructed to come back for suture removal in 10 days. Deliah Boston, MS, PA-C   9:49 AM, 12/07/2014

## 2014-12-10 ENCOUNTER — Ambulatory Visit (INDEPENDENT_AMBULATORY_CARE_PROVIDER_SITE_OTHER): Payer: No Typology Code available for payment source | Admitting: Family Medicine

## 2014-12-10 ENCOUNTER — Encounter: Payer: Self-pay | Admitting: Family Medicine

## 2014-12-10 VITALS — BP 138/86 | HR 108 | Ht 64.25 in | Wt 167.0 lb

## 2014-12-10 DIAGNOSIS — S91052A Open bite, left ankle, initial encounter: Secondary | ICD-10-CM

## 2014-12-10 DIAGNOSIS — M5416 Radiculopathy, lumbar region: Secondary | ICD-10-CM | POA: Diagnosis not present

## 2014-12-10 DIAGNOSIS — S91059A Open bite, unspecified ankle, initial encounter: Secondary | ICD-10-CM | POA: Insufficient documentation

## 2014-12-10 DIAGNOSIS — W540XXA Bitten by dog, initial encounter: Secondary | ICD-10-CM

## 2014-12-10 MED ORDER — GABAPENTIN 100 MG PO CAPS: 200.0000 mg | ORAL_CAPSULE | Freq: Two times a day (BID) | ORAL | Status: DC

## 2014-12-10 NOTE — Patient Instructions (Signed)
Good to see you Ice is your friend For the dog bite try arnica lotion to help with the bruising.  Continue the exercises Gabapentin 200mg  in AM, 200mg  pm and 300mg  at night Finish the antibiotics Naproxen 2 times daily for 10 days You are doing great and keep it up!!! See me again in 6 weeks.

## 2014-12-10 NOTE — Assessment & Plan Note (Addendum)
Patient has been doing remarkably well. We discussed home exercises only icing protocol. We did change the gabapentin to 200 mg in morning as well as afternoon in continuing to 300 mg at night. Patient does have tramadol for breakthrough pain. I do not think that any significant changes need to be made even though she is having some mild increase in radicular symptoms secondary to her recent fall after the dog bite. I do believe she will get better quickly. Patient will continue to follow up with me 6 week intervals. Spent  25 minutes with patient face-to-face and had greater than 50% of counseling including as described above in assessment and plan.

## 2014-12-10 NOTE — Progress Notes (Signed)
Pre visit review using our clinic review tool, if applicable. No additional management support is needed unless otherwise documented below in the visit note. 

## 2014-12-10 NOTE — Progress Notes (Signed)
[name] [address] [place], [place] [address] Phone: [phone] Subjective:    I'm seeing this patient by the request  of:  Dr. Merla Riches  CC: Low back pain with left-sided sciatica  BJY:[id number] Amber Noble is a 54 y.o. female coming in with complaint of low back pain. Patient states that most of this seems to be left-sided. Patient has been seen over the course of multiple years for this problem. Patient was most recently seen by Dr. Merla Riches in April. Patient had new x-rays that did show some chronic coccygeal fracture that has been seen on previous x-rays. These were reviewed by me. No significant changes noted. Patient does have a past medical history significant for an L4-L5 posterior fusion.   She was seen previously and continued to have a lumbar radiculopathy.. Patient states that the radicular symptoms is somewhat better. Patient is now at gabapentin 300 mg at night and started the gabapentin 100 mg in the a.m. and p.m. The pain is not stopping her from any activity and she is sleeping better at night. Patient actually states she was doing significantly well until she was unfortunately bitten by her dog recently. Patient fell onto her left side and started having this comfort. Patient had a repeat x-ray of her back did not show any loosening. Patient is still having some mild increase in radicular symptoms than what she was having previously but nothing that is stopping her from activity. Patient was given a short course of anti-inflammatory's as well. Patient states it seems to be getting better slowly.  Past Medical History  Diagnosis Date  . Hypertension   . Hyperlipidemia   . Depression   . GERD (gastroesophageal reflux disease)    Past Surgical History  Procedure Laterality Date  . Back surgery    . Abdominal hysterectomy     Allergies  Allergen Reactions  . Contrast Media [Iodinated Diagnostic Agents] Hives and Swelling    Throat   13 hour prep  . Vicodin [Hydrocodone-Acetaminophen] Itching  . Iohexol Rash    Desc: PREMED BENADRYL    Family History  Problem Relation Age of Onset  . Hypertension Mother    History  Substance Use Topics  . Smoking status: Current Every Day Smoker  . Smokeless tobacco: Not on file  . Alcohol Use: No       Past medical history, social, surgical and family history all reviewed in electronic medical record.   Review of Systems: No headache, visual changes, nausea, vomiting, diarrhea, constipation, dizziness, abdominal pain, skin rash, fevers, chills, night sweats, weight loss, swollen lymph nodes, body aches, joint swelling, muscle aches, chest pain, shortness of breath, mood changes.   Objective Pulse 108, height 5' 4.25" (1.632 m), weight 167 lb (75.751 kg), last menstrual period 07/01/2011, SpO2 95 %.  General: No apparent distress alert and oriented x3 mood and affect normal, dressed appropriately.  HEENT: Pupils equal, extraocular movements intact  Respiratory: Patient's speak in full sentences and does not appear short of breath  Cardiovascular: No lower extremity edema, non tender, no erythema  Skin: Patient's lower extremity on right side shows healing diabetic contusion noted. No signs of infection at this time. Abdomen: Soft nontender  Neuro: Cranial nerves II through XII are intact, neurovascularly intact in all extremities with 2+ DTRs and 2+ pulses.  Lymph: No lymphadenopathy of posterior or anterior cervical chain or axillae bilaterally.  Gait normal with good balance and coordination.  MSK:  Non tender with  full range of motion and good stability and symmetric strength and tone of shoulders, elbows, wrist, hip, knee and ankles bilaterally.  Back Exam:  Inspection: incision well-healed Motion: Flexion 40 deg, Extension 35 deg, Side Bending to 35 deg bilaterally, Rotation to 35 deg bilaterally . Range of motion is previous SLR laying: positive left side still present  and minorly worse XSLR laying: Negative  Palpable tenderness: Mild increase in tenderness likely from recent fall FABER: positive left side Sensory change: Gross sensation intact to all lumbar and sacral dermatomes.  Reflexes: 2+ at both patellar tendons, 2+ at achilles tendons, Babinski's downgoing.  Strength at foot  Plantar-flexion: 5/5 Dorsi-flexion: 5/5 Eversion: 5/5 Inversion: 5/5  Leg strength  Quad: 5/5 Hamstring: 5/5 Hip flexor: 4/5 Hip abductors: 4/5  Gait unremarkable. No change in strength.       Impression and Recommendations:     This case required medical decision making of moderate complexity.

## 2014-12-17 ENCOUNTER — Ambulatory Visit (INDEPENDENT_AMBULATORY_CARE_PROVIDER_SITE_OTHER): Payer: No Typology Code available for payment source | Admitting: Physician Assistant

## 2014-12-17 VITALS — BP 120/90 | HR 67 | Temp 98.3°F | Resp 18 | Ht 64.25 in | Wt 167.4 lb

## 2014-12-17 DIAGNOSIS — M25552 Pain in left hip: Secondary | ICD-10-CM

## 2014-12-17 DIAGNOSIS — S81811D Laceration without foreign body, right lower leg, subsequent encounter: Secondary | ICD-10-CM

## 2014-12-17 NOTE — Progress Notes (Signed)
[facility] [address] [place] [address] [phone]  Date:  12/17/2014   Name:  Amber Noble   DOB:  01-Jan-1961   MRN:  [id number]  PCP:  No PCP Per Patient    Chief Complaint: Suture / Staple Removal   History of Present Illness:  This is a 54 y.o. female who is presenting for suture removal. She was seen here on 12/07/14 after a dog bite on her right calf. Wound was cleaned thoroughly and #2 sutures placed to repair two separate puncture wounds. She was placed on Augmentin - she has 1 dose left. She denies significant pain, fever, chills, drainage. Pt states she is still having residual pain from left groin strain that occurred during dog bite encounter. She has been taking anaprox, robaxin, neurontin and applying ice and helping some.  Review of Systems:  Review of Systems See HPI  Patient Active Problem List   Diagnosis Date Noted  . Dog bite of ankle 12/10/2014  . Lumbar radiculopathy 10/22/2014  . Peripheral vertigo 07/13/2014  . Imbalance 06/09/2014  . Abnormal gait 06/09/2014  . Rupture of distal biceps tendon 02/20/2014  . Pain in thoracic spine 02/20/2014  . Thoracic and lumbosacral neuritis 07/20/2011    Prior to Admission medications   Medication Sig Start Date End Date Taking? Authorizing Provider  ALPRAZolam Prudy Feeler) 1 MG tablet Take 1 mg by mouth as needed for anxiety.   Yes Historical Provider, MD  amoxicillin-clavulanate (AUGMENTIN) 875-125 MG per tablet Take 1 tablet by mouth 2 (two) times daily. 12/07/14  Yes Carmelina Dane, MD  gabapentin (NEURONTIN) 100 MG capsule Take 2 capsules (200 mg total) by mouth 2 (two) times daily. 12/10/14  Yes Judi Saa, DO  gabapentin (NEURONTIN) 300 MG capsule 1 pill nightly 11/13/14  Yes Judi Saa, DO  hydrochlorothiazide (HYDRODIURIL) 25 MG tablet Take 25 mg by mouth daily.   Yes Historical Provider, MD  naproxen sodium (ANAPROX DS) 550 MG tablet Take 1 tablet (550 mg total) by mouth  2 (two) times daily with a meal. 12/07/14 12/07/15 Yes Carmelina Dane, MD  omeprazole (PRILOSEC) 40 MG capsule Take 40 mg by mouth daily.   Yes Historical Provider, MD  PARoxetine (PAXIL) 40 MG tablet Take 40 mg by mouth every morning.   Yes Historical Provider, MD  pravastatin (PRAVACHOL) 40 MG tablet Take 40 mg by mouth daily.   Yes Historical Provider, MD           Allergies  Allergen Reactions  . Contrast Media [Iodinated Diagnostic Agents] Hives and Swelling    Throat  13 hour prep  . Vicodin [Hydrocodone-Acetaminophen] Itching  . Iohexol Rash    Desc: PREMED BENADRYL     Past Surgical History  Procedure Laterality Date  . Back surgery    . Abdominal hysterectomy      History  Substance Use Topics  . Smoking status: Current Every Day Smoker  . Smokeless tobacco: Not on file  . Alcohol Use: No    Family History  Problem Relation Age of Onset  . Hypertension Mother     Medication list has been reviewed and updated.  Physical Examination:  Physical Exam  Constitutional: She is oriented to person, place, and time. She appears well-developed and well-nourished. No distress.  HENT:  Head: Normocephalic and atraumatic.  Right Ear: Hearing normal.  Left Ear: Hearing normal.  Nose: Nose normal.  Eyes: Conjunctivae and lids are normal. Right eye exhibits no  discharge. Left eye exhibits no discharge. No scleral icterus.  Pulmonary/Chest: Effort normal. No respiratory distress.  Musculoskeletal: Normal range of motion.  Neurological: She is alert and oriented to person, place, and time.  Skin: Skin is warm, dry and intact.  Two 1 cm circular scabs on left calf with 1 suture each in place. No erythema, induration or drainage. Sutures removed.  Psychiatric: She has a normal mood and affect. Her speech is normal and behavior is normal. Thought content normal.   BP 120/90 mmHg  Pulse 67  Temp(Src) 98.3 F (36.8 C) (Oral)  Resp 18  Ht 5' 4.25" (1.632 m)  Wt 167 lb 6  oz (75.921 kg)  BMI 28.51 kg/m2  LMP 07/01/2011  Assessment and Plan:  1. Left hip pain 2. Laceration of right leg excluding thigh, subsequent encounter Wounds have healed well. #2 sutures removed. Finish abx. Continue anaprox, robaxin, neurontin and heat for left groin pain. Return if not improving in 2 weeks.   Roswell MinersNicole V. Dyke BrackettBush, PA-C, MHS [facility]  12/17/2014

## 2015-01-13 ENCOUNTER — Ambulatory Visit: Payer: No Typology Code available for payment source | Admitting: Family Medicine

## 2015-01-21 ENCOUNTER — Encounter: Payer: Self-pay | Admitting: Family Medicine

## 2015-01-21 ENCOUNTER — Ambulatory Visit (INDEPENDENT_AMBULATORY_CARE_PROVIDER_SITE_OTHER): Payer: No Typology Code available for payment source | Admitting: Family Medicine

## 2015-01-21 VITALS — BP 138/84 | HR 73 | Ht 64.25 in | Wt 161.0 lb

## 2015-01-21 DIAGNOSIS — M5416 Radiculopathy, lumbar region: Secondary | ICD-10-CM | POA: Diagnosis not present

## 2015-01-21 MED ORDER — GABAPENTIN 300 MG PO CAPS: 300.0000 mg | ORAL_CAPSULE | Freq: Three times a day (TID) | ORAL | Status: DC

## 2015-01-21 NOTE — Progress Notes (Signed)
[name] [address] [place], [place] [address] Phone: [phone] Subjective:    I'm seeing this patient by the request  of:  Dr. Merla Riches  CC: Low back pain with left-sided sciatica  [id number] Amber Noble is a 54 y.o. female coming in with complaint of low back pain. Patient states that most of this seems to be left-sided. Patient has been seen over the course of multiple years for this problem. Patient was most recently seen by Dr. Merla Riches in April. Patient had new x-rays that did show some chronic coccygeal fracture that has been seen on previous x-rays. These were reviewed by me. No significant changes noted. Patient does have a past medical history significant for an L4-L5 posterior fusion.   She was seen previously and continued to have a lumbar radiculopathy. Patient at last follow-up was continuing to have some radiculopathy and we increase gabapentin to 200 mg in a.m. and p.m. and 300 mg at night. Patient was to continue with home exercises, icing, and medications that were prescribed. Patient states over all her back seems to be doing relatively well. Patient states that the radicular symptoms seems to be improving. The gabapentin does not make her tired. Still having some mild hip pain. Patient's hip x-rays were unremarkable previously. States that sometimes it can give her a catching sensation but has not fallen. Not affecting her daily activities.  Past Medical History  Diagnosis Date  . Hypertension   . Hyperlipidemia   . Depression   . GERD (gastroesophageal reflux disease)    Past Surgical History  Procedure Laterality Date  . Back surgery    . Abdominal hysterectomy     Allergies  Allergen Reactions  . Contrast Media [Iodinated Diagnostic Agents] Hives and Swelling    Throat  13 hour prep  . Vicodin [Hydrocodone-Acetaminophen] Itching  . Iohexol Rash    Desc: PREMED BENADRYL    Family History  Problem Relation Age of  Onset  . Hypertension Mother    History  Substance Use Topics  . Smoking status: Current Every Day Smoker  . Smokeless tobacco: Not on file  . Alcohol Use: No       Past medical history, social, surgical and family history all reviewed in electronic medical record.   Review of Systems: No headache, visual changes, nausea, vomiting, diarrhea, constipation, dizziness, abdominal pain, skin rash, fevers, chills, night sweats, weight loss, swollen lymph nodes, body aches, joint swelling, muscle aches, chest pain, shortness of breath, mood changes.   Objective Blood pressure 138/84, pulse 73, height 5' 4.25" (1.632 m), weight 161 lb (73.029 kg), last menstrual period 07/01/2011, SpO2 93 %.  General: No apparent distress alert and oriented x3 mood and affect normal, dressed appropriately.  HEENT: Pupils equal, extraocular movements intact  Respiratory: Patient's speak in full sentences and does not appear short of breath  Cardiovascular: No lower extremity edema, non tender, no erythema  Skin: Patient's lower extremity on right side shows healing diabetic contusion noted. No signs of infection at this time. Abdomen: Soft nontender  Neuro: Cranial nerves II through XII are intact, neurovascularly intact in all extremities with 2+ DTRs and 2+ pulses.  Lymph: No lymphadenopathy of posterior or anterior cervical chain or axillae bilaterally.  Gait normal with good balance and coordination.  MSK:  Non tender with full range of motion and good stability and symmetric strength and tone of shoulders, elbows, wrist, hip, knee and ankles bilaterally.  Back Exam:  Inspection: incision  well-healed Motion: Flexion 40 deg, Extension 35 deg, Side Bending to 35 deg bilaterally, Rotation to 35 deg bilaterally . Continued improvement in range of motion SLR laying: Minorly positive left side but improved from previous exam XSLR laying: Negative  Palpable tenderness: Less tender than previous exam FABER:  positive left side Sensory change: Gross sensation intact to all lumbar and sacral dermatomes.  Reflexes: 2+ at both patellar tendons, 2+ at achilles tendons, Babinski's downgoing.  Strength at foot  Plantar-flexion: 5/5 Dorsi-flexion: 5/5 Eversion: 5/5 Inversion: 5/5  Leg strength  Quad: 5/5 Hamstring: 5/5 Hip flexor: 4/5 Hip abductors: 4/5  Gait unremarkable. No change in strength.       Impression and Recommendations:     This case required medical decision making of moderate complexity.

## 2015-01-21 NOTE — Progress Notes (Signed)
Pre visit review using our clinic review tool, if applicable. No additional management support is needed unless otherwise documented below in the visit note. 

## 2015-01-21 NOTE — Assessment & Plan Note (Signed)
Patient is doing much better at this time. We discussed icing regimen and home exercises. Patient was given a new exercise prescription which apical be beneficial. We discussed continuing the over-the-counter medications. We did increase patient's gabapentin to 300 mg 3 times a day. We discussed his lungs patient continues to do well we will continue conservative therapy and patient will come back in 2 months for further evaluation.  Spent  25 minutes with patient face-to-face and had greater than 50% of counseling including as described above in assessment and plan.

## 2015-01-21 NOTE — Patient Instructions (Addendum)
Good to see you Gabapentin 300 mg 3 times a day New exercises for the hip muscle Compression sleeve can help Enjoy the rest of the summer See m ei n8 weeks.

## 2015-03-18 ENCOUNTER — Ambulatory Visit (INDEPENDENT_AMBULATORY_CARE_PROVIDER_SITE_OTHER): Payer: No Typology Code available for payment source | Admitting: Family Medicine

## 2015-03-18 ENCOUNTER — Encounter: Payer: Self-pay | Admitting: Family Medicine

## 2015-03-18 VITALS — BP 136/82 | HR 78 | Ht 64.25 in | Wt 157.0 lb

## 2015-03-18 DIAGNOSIS — M5416 Radiculopathy, lumbar region: Secondary | ICD-10-CM | POA: Diagnosis not present

## 2015-03-18 MED ORDER — METHYLPREDNISOLONE ACETATE 80 MG/ML IJ SUSP
80.0000 mg | Freq: Once | INTRAMUSCULAR | Status: AC
  Administered 2015-03-18: 80 mg via INTRAMUSCULAR

## 2015-03-18 MED ORDER — KETOROLAC TROMETHAMINE 60 MG/2ML IM SOLN
60.0000 mg | Freq: Once | INTRAMUSCULAR | Status: AC
  Administered 2015-03-18: 60 mg via INTRAMUSCULAR

## 2015-03-18 MED ORDER — TRAMADOL HCL 50 MG PO TABS: 50.0000 mg | ORAL_TABLET | Freq: Two times a day (BID) | ORAL | Status: DC | PRN

## 2015-03-18 MED ORDER — PREDNISONE 50 MG PO TABS: 50.0000 mg | ORAL_TABLET | Freq: Every day | ORAL | Status: DC

## 2015-03-18 NOTE — Progress Notes (Signed)
[name] [address] [place], [place] [address] Phone: [phone] Subjective:    I'm seeing this patient by the request  of:  Dr. Merla Riches  CC: Low back pain with left-sided sciatica  [id number] Amber Noble is a 54 y.o. female coming in with complaint of low back pain. Patient states that most of this seems to be left-sided. Patient has been seen over the course of multiple years for this problem.   Patient does have a past medical history significant for an L4-L5 posterior fusion.   She was seen previously and continued to have a lumbar radiculopathy. Patient at last follow-up was continuing to have some radiculopathy and we increase gabapentin to 300 mg 3 times a day. Patient was to continue with home exercises, icing, and medications that were prescribed. Patient continued to have some radicular symptoms though was making progress. Patient states unfortunately over the last 2 weeks she is been having more of an exacerbation. Patient states that the pain is going much more down the left leg and having some weakness. Patient is felt uncomfortable getting up from a standing position.  Past Medical History  Diagnosis Date  . Hypertension   . Hyperlipidemia   . Depression   . GERD (gastroesophageal reflux disease)    Past Surgical History  Procedure Laterality Date  . Back surgery    . Abdominal hysterectomy     Allergies  Allergen Reactions  . Contrast Media [Iodinated Diagnostic Agents] Hives and Swelling    Throat  13 hour prep  . Vicodin [Hydrocodone-Acetaminophen] Itching  . Iohexol Rash    Desc: PREMED BENADRYL    Family History  Problem Relation Age of Onset  . Hypertension Mother    Social History  Substance Use Topics  . Smoking status: Current Every Day Smoker  . Smokeless tobacco: None  . Alcohol Use: No       Past medical history, social, surgical and family history all reviewed in electronic medical record.    Review of Systems: No headache, visual changes, nausea, vomiting, diarrhea, constipation, dizziness, abdominal pain, skin rash, fevers, chills, night sweats, weight loss, swollen lymph nodes, body aches, joint swelling, muscle aches, chest pain, shortness of breath, mood changes.   Objective Blood pressure 136/82, pulse 78, height 5' 4.25" (1.632 m), weight 157 lb (71.215 kg), last menstrual period 07/01/2011, SpO2 94 %.  General: No apparent distress alert and oriented x3 mood and affect normal, dressed appropriately.  HEENT: Pupils equal, extraocular movements intact  Respiratory: Patient's speak in full sentences and does not appear short of breath  Cardiovascular: No lower extremity edema, non tender, no erythema  Skin: Patient's lower extremity on right side shows healing diabetic contusion noted. No signs of infection at this time. Abdomen: Soft nontender  Neuro: Cranial nerves II through XII are intact, neurovascularly intact in all extremities with 2+ DTRs and 2+ pulses.  Lymph: No lymphadenopathy of posterior or anterior cervical chain or axillae bilaterally.  Gait normal with good balance and coordination.  MSK:  Non tender with full range of motion and good stability and symmetric strength and tone of shoulders, elbows, wrist, hip, knee and ankles bilaterally.  Back Exam:  Inspection: incision well-healed Motion: Flexion 40 deg, Extension 35 deg, Side Bending to 35 deg bilaterally, Rotation to 35 deg bilaterally . Continued improvement in range of motion SLR laying: Severely positive left XSLR laying: Negative  Palpable tenderness: Worsening tenderness over the paraspinal musculature over L4-L5 and S1  on the left side FABER: positive left side Sensory change: Gross sensation intact to all lumbar and sacral dermatomes.  Reflexes: 2+ at both patellar tendons, 2+ at achilles tendons, Babinski's downgoing.  Strength at foot  Plantar-flexion: 5/5 Dorsi-flexion: 5/5 Eversion: 5/5  Inversion: 5/5  Leg strength  Quad: 5/5 Hamstring: 5/5 Hip flexor: 4/5 Hip abductors: 4/5  Gait unremarkable. No change in strength.       Impression and Recommendations:     This case required medical decision making of moderate complexity.

## 2015-03-18 NOTE — Assessment & Plan Note (Addendum)
Unfortunately patient is having worsening symptoms at this time. Patient given intramuscular injections today to help with the pain. Patient also will do a 5 day burst of prednisone. We discussed icing regimen home exercises and continuing the gabapentin. Patient will call after the weekend if any worsening symptoms we will do this imaging including an MRI. Patient does not have any significant weakness at the point but is having a much more positive straight leg test today. Patient will come back in 2 weeks otherwise if she does respond to the medication.  Spent  25 minutes with patient face-to-face and had greater than 50% of counseling including as described above in assessment and plan.

## 2015-03-18 NOTE — Patient Instructions (Addendum)
Good to see you Amber Noble, Amber Noble Amber Noble baby Prednisone daily for 5 days starting tomorrow  After done with prednisone try duecxis 3 times daily for 3 days Late in the day ok to use brace tramadol with 1 or 2 tylenols 2 times a day as needed  On Monday send me a message and if not better we will get MRI Otherwise see me in 2 weeks.

## 2015-03-18 NOTE — Progress Notes (Signed)
Pre visit review using our clinic review tool, if applicable. No additional management support is needed unless otherwise documented below in the visit note. 

## 2015-04-06 ENCOUNTER — Ambulatory Visit: Payer: No Typology Code available for payment source | Admitting: Family Medicine

## 2015-04-06 DIAGNOSIS — Z0289 Encounter for other administrative examinations: Secondary | ICD-10-CM

## 2015-04-07 ENCOUNTER — Encounter: Payer: Self-pay | Admitting: Family Medicine

## 2015-04-08 MED ORDER — TRAMADOL HCL 50 MG PO TABS: 50.0000 mg | ORAL_TABLET | Freq: Three times a day (TID) | ORAL | Status: DC | PRN

## 2015-04-13 ENCOUNTER — Ambulatory Visit (INDEPENDENT_AMBULATORY_CARE_PROVIDER_SITE_OTHER): Payer: No Typology Code available for payment source | Admitting: Family Medicine

## 2015-04-13 ENCOUNTER — Encounter: Payer: Self-pay | Admitting: Family Medicine

## 2015-04-13 VITALS — BP 130/84 | HR 59 | Ht 64.25 in | Wt 162.0 lb

## 2015-04-13 DIAGNOSIS — M5416 Radiculopathy, lumbar region: Secondary | ICD-10-CM | POA: Diagnosis not present

## 2015-04-13 MED ORDER — KETOROLAC TROMETHAMINE 60 MG/2ML IM SOLN
60.0000 mg | Freq: Once | INTRAMUSCULAR | Status: AC
  Administered 2015-04-13: 60 mg via INTRAMUSCULAR

## 2015-04-13 MED ORDER — GABAPENTIN 400 MG PO CAPS: 400.0000 mg | ORAL_CAPSULE | Freq: Four times a day (QID) | ORAL | Status: DC

## 2015-04-13 MED ORDER — METHOCARBAMOL 500 MG PO TABS: 500.0000 mg | ORAL_TABLET | Freq: Three times a day (TID) | ORAL | Status: DC

## 2015-04-13 NOTE — Patient Instructions (Signed)
Good to see you One injection today Robaxin up to 3 times daily Gabapentin 300mg  3 times daily Tramadol with a tylenol up to 3 times a day Meloxicam daily for 10 days then as needed starting tomorrow.  We will get the CT scan and will release the results.

## 2015-04-13 NOTE — Progress Notes (Signed)
Pre visit review using our clinic review tool, if applicable. No additional management support is needed unless otherwise documented below in the visit note. 

## 2015-04-13 NOTE — Progress Notes (Signed)
[facility] [address] [place], [place] [address] Phone: [phone] Subjective:     CC: Low back pain with left-sided sciatica follow-up OZH:[id number]:Subjective Amber Noble is a 54 y.o. female coming in with complaint of low back pain. Patient states that most of this seems to be left-sided. Patient has been seen over the course of multiple years for this problem.   Patient does have a past medical history significant for an L4-L5 posterior fusion.   She was seen previously and continued to have a lumbar radiculopathy. Seems to be worsening overall. Patient is unfortunately had increase her gabapentin to 600-900 mg multiple times a day. Patient states that it is making it almost impossible for her to do anything other than her daily activities. Patient states even then she's been in bed sometimes for the entire day. Patient states that the pain seems to be chronic. Even waking her up at night. Feels very similar to prior to her surgery.  Past Medical History  Diagnosis Date  . Hypertension   . Hyperlipidemia   . Depression   . GERD (gastroesophageal reflux disease)    Past Surgical History  Procedure Laterality Date  . Back surgery    . Abdominal hysterectomy     Allergies  Allergen Reactions  . Contrast Media [Iodinated Diagnostic Agents] Hives and Swelling    Throat  13 hour prep  . Vicodin [Hydrocodone-Acetaminophen] Itching  . Iohexol Rash    Desc: PREMED BENADRYL    Family History  Problem Relation Age of Onset  . Hypertension Mother    Social History  Substance Use Topics  . Smoking status: Current Every Day Smoker  . Smokeless tobacco: None  . Alcohol Use: No       Past medical history, social, surgical and family history all reviewed in electronic medical record.   Review of Systems: No headache, visual changes, nausea, vomiting, diarrhea, constipation, dizziness, abdominal pain, skin rash, fevers, chills, night sweats, weight loss,  swollen lymph nodes, body aches, joint swelling, muscle aches, chest pain, shortness of breath, mood changes.   Objective Blood pressure 130/84, pulse 59, height 5' 4.25" (1.632 m), weight 162 lb (73.483 kg), last menstrual period 07/01/2011, SpO2 98 %.  General: No apparent distress alert and oriented x3 mood and affect normal, dressed appropriately.  HEENT: Pupils equal, extraocular movements intact  Respiratory: Patient's speak in full sentences and does not appear short of breath  Cardiovascular: No lower extremity edema, non tender, no erythema  Skin: Patient's lower extremity on right side shows healing diabetic contusion noted. No signs of infection at this time. Abdomen: Soft nontender  Neuro: Cranial nerves II through XII are intact, neurovascularly intact in all extremities with 2+ DTRs and 2+ pulses.  Lymph: No lymphadenopathy of posterior or anterior cervical chain or axillae bilaterally.  Gait normal with good balance and coordination.  MSK:  Non tender with full range of motion and good stability and symmetric strength and tone of shoulders, elbows, wrist, hip, knee and ankles bilaterally.  Back Exam:  Inspection: incision well-healed Motion: Flexion 35 deg, Extension 25 deg, Side Bending to 25 deg bilaterally, Rotation to 20 deg bilaterally . Worsening range of motion SLR laying: Severely positive left XSLR laying: Positive on the contralateral side Palpable tenderness: Worsening tenderness over the paraspinal musculature over L4-L5 and S1 on the left side FABER: positive left side Sensory change: Gross sensation intact to all lumbar and sacral dermatomes.  Reflexes: 2+ at both patellar  tendons, 2+ at achilles tendons, Babinski's downgoing.  Strength at foot  Plantar-flexion: 5/5 Dorsi-flexion: 5/5 Eversion: 5/5 Inversion: 5/5  Leg strength  Quad: 5/5 Hamstring: 5/5 Hip flexor: 4/5 Hip abductors: 4/5  Gait unremarkable. No change in strength.       Impression and  Recommendations:     This case required medical decision making of moderate complexity.

## 2015-04-13 NOTE — Assessment & Plan Note (Signed)
Continues to have significant difficult he. 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 We have had x-rays already that did not show any remarkable loosening of the posterior middle fusion. I do feel that a CT myelogram could be beneficial. This was ordered to be today. Patient may need surgical intervention. Patient will be referred to neurology if necessary. Patient was given an increasing the gabapentin to 400 mg 4 times a day, prescription for muscle relaxer, and patient does have a recent tramadol prescription. Patient will also take a anti-inflammatory daily for the next 10 days. Patient will come back and see me again after the CT myelogram and we'll discuss.  Spent  25 minutes with patient face-to-face and had greater than 50% of counseling including as described above in assessment and plan.

## 2015-04-14 ENCOUNTER — Ambulatory Visit: Payer: No Typology Code available for payment source | Admitting: Family Medicine

## 2015-04-22 ENCOUNTER — Encounter: Payer: Self-pay | Admitting: Family Medicine

## 2015-04-30 ENCOUNTER — Ambulatory Visit
Admission: RE | Admit: 2015-04-30 | Discharge: 2015-04-30 | Disposition: A | Discharge status: Home / Self Care | Payer: No Typology Code available for payment source | Source: Ambulatory Visit | Attending: Family Medicine | Admitting: Family Medicine

## 2015-04-30 ENCOUNTER — Other Ambulatory Visit: Payer: Self-pay | Admitting: Family Medicine

## 2015-04-30 VITALS — BP 178/91 | HR 69

## 2015-04-30 DIAGNOSIS — M5416 Radiculopathy, lumbar region: Secondary | ICD-10-CM

## 2015-04-30 DIAGNOSIS — M5417 Radiculopathy, lumbosacral region: Principal | ICD-10-CM

## 2015-04-30 DIAGNOSIS — M5414 Radiculopathy, thoracic region: Secondary | ICD-10-CM

## 2015-04-30 MED ORDER — DIAZEPAM 5 MG PO TABS
10.0000 mg | ORAL_TABLET | Freq: Once | ORAL | Status: AC
  Administered 2015-04-30: 10 mg via ORAL

## 2015-04-30 MED ORDER — ONDANSETRON HCL 4 MG/2ML IJ SOLN: 4.0000 mg | Freq: Once | INTRAMUSCULAR | Status: DC

## 2015-04-30 MED ORDER — HYDROMORPHONE HCL 1 MG/ML IJ SOLN
1.0000 mg | Freq: Once | INTRAMUSCULAR | Status: AC
  Administered 2015-04-30: 1 mg via INTRAMUSCULAR

## 2015-04-30 MED ORDER — IOHEXOL 180 MG/ML  SOLN
17.0000 mL | Freq: Once | INTRAMUSCULAR | Status: DC | PRN
  Administered 2015-04-30: 17 mL via INTRATHECAL

## 2015-04-30 MED ORDER — OXYCODONE-ACETAMINOPHEN 5-325 MG PO TABS
2.0000 | ORAL_TABLET | Freq: Once | ORAL | Status: AC
  Administered 2015-04-30: 2 via ORAL

## 2015-04-30 NOTE — Discharge Instructions (Signed)
Myelogram Discharge Instructions  1. Go home and rest quietly for the next 24 hours.  It is important to lie flat for the next 24 hours.  Get up only to go to the restroom.  You may lie in the bed or on a couch on your back, your stomach, your left side or your right side.  You may have one pillow under your head.  You may have pillows between your knees while you are on your side or under your knees while you are on your back.  2. DO NOT drive today.  Recline the seat as far back as it will go, while still wearing your seat belt, on the way home.  3. You may get up to go to the bathroom as needed.  You may sit up for 10 minutes to eat.  You may resume your normal diet and medications unless otherwise indicated.  Drink lots of extra fluids today and tomorrow.  4. The incidence of headache, nausea, or vomiting is about 5% (one in 20 patients).  If you develop a headache, lie flat and drink plenty of fluids until the headache goes away.  Caffeinated beverages may be helpful.  If you develop severe nausea and vomiting or a headache that does not go away with flat bed rest, call [phone].  5. You may resume normal activities after your 24 hours of bed rest is over; however, do not exert yourself strongly or do any heavy lifting tomorrow. If when you get up you have a headache when standing, go back to bed and force fluids for another 24 hours.  6. Call your physician for a follow-up appointment.  The results of your myelogram will be sent directly to your physician by the following day.  7. If you have any questions or if complications develop after you arrive home, please call [phone].  Discharge instructions have been explained to the patient.  The patient, or the person responsible for the patient, fully understands these instructions.       May resume Paxil and Tramadol on Nov. 12, 2016, after 11:00 am.

## 2015-04-30 NOTE — Progress Notes (Signed)
Pt states she has been off Tramadol and Paxil for the past 2 days.  Discharge instructions explained to pt.

## 2015-05-03 ENCOUNTER — Encounter: Payer: Self-pay | Admitting: Family Medicine

## 2015-05-03 DIAGNOSIS — M5416 Radiculopathy, lumbar region: Secondary | ICD-10-CM

## 2015-05-18 ENCOUNTER — Ambulatory Visit (INDEPENDENT_AMBULATORY_CARE_PROVIDER_SITE_OTHER): Payer: No Typology Code available for payment source | Admitting: Physician Assistant

## 2015-05-18 VITALS — BP 187/90 | HR 79 | Temp 98.5°F | Resp 16 | Ht 64.25 in | Wt 161.8 lb

## 2015-05-18 DIAGNOSIS — Z20818 Contact with and (suspected) exposure to other bacterial communicable diseases: Secondary | ICD-10-CM

## 2015-05-18 DIAGNOSIS — R0989 Other specified symptoms and signs involving the circulatory and respiratory systems: Secondary | ICD-10-CM | POA: Diagnosis not present

## 2015-05-18 DIAGNOSIS — Z2089 Contact with and (suspected) exposure to other communicable diseases: Secondary | ICD-10-CM

## 2015-05-18 MED ORDER — GUAIFENESIN ER 1200 MG PO TB12: 1.0000 | ORAL_TABLET | Freq: Two times a day (BID) | ORAL | Status: DC | PRN

## 2015-05-18 MED ORDER — PROMETHAZINE-DM 6.25-15 MG/5ML PO SYRP: 5.0000 mL | ORAL_SOLUTION | Freq: Every evening | ORAL | Status: DC | PRN

## 2015-05-18 MED ORDER — IPRATROPIUM BROMIDE 0.03 % NA SOLN: 2.0000 | Freq: Two times a day (BID) | NASAL | Status: DC

## 2015-05-18 MED ORDER — AZITHROMYCIN 250 MG PO TABS: ORAL_TABLET | ORAL | Status: DC

## 2015-05-18 NOTE — Patient Instructions (Signed)
Upper Respiratory Infection, Adult Most upper respiratory infections (URIs) are a viral infection of the air passages leading to the lungs. A URI affects the nose, throat, and upper air passages. The most common type of URI is nasopharyngitis and is typically referred to as "the common cold." URIs run their course and usually go away on their own. Most of the time, a URI does not require medical attention, but sometimes a bacterial infection in the upper airways can follow a viral infection. This is called a secondary infection. Sinus and middle ear infections are common types of secondary upper respiratory infections. Bacterial pneumonia can also complicate a URI. A URI can worsen asthma and chronic obstructive pulmonary disease (COPD). Sometimes, these complications can require emergency medical care and may be life threatening.  CAUSES Almost all URIs are caused by viruses. A virus is a type of germ and can spread from one person to another.  RISKS FACTORS You may be at risk for a URI if:   You smoke.   You have chronic heart or lung disease.  You have a weakened defense (immune) system.   You are very young or very old.   You have nasal allergies or asthma.  You work in crowded or poorly ventilated areas.  You work in health care facilities or schools. SIGNS AND SYMPTOMS  Symptoms typically develop 2-3 days after you come in contact with a cold virus. Most viral URIs last 7-10 days. However, viral URIs from the influenza virus (flu virus) can last 14-18 days and are typically more severe. Symptoms may include:   Runny or stuffy (congested) nose.   Sneezing.   Cough.   Sore throat.   Headache.   Fatigue.   Fever.   Loss of appetite.   Pain in your forehead, behind your eyes, and over your cheekbones (sinus pain).  Muscle aches.  DIAGNOSIS  Your health care provider may diagnose a URI by:  Physical exam.  Tests to check that your symptoms are not due to  another condition such as:  Strep throat.  Sinusitis.  Pneumonia.  Asthma. TREATMENT  A URI goes away on its own with time. It cannot be cured with medicines, but medicines may be prescribed or recommended to relieve symptoms. Medicines may help:  Reduce your fever.  Reduce your cough.  Relieve nasal congestion. HOME CARE INSTRUCTIONS   Take medicines only as directed by your health care provider.   Gargle warm saltwater or take cough drops to comfort your throat as directed by your health care provider.  Use a warm mist humidifier or inhale steam from a shower to increase air moisture. This may make it easier to breathe.  Drink enough fluid to keep your urine clear or pale yellow.   Eat soups and other clear broths and maintain good nutrition.   Rest as needed.   Return to work when your temperature has returned to normal or as your health care provider advises. You may need to stay home longer to avoid infecting others. You can also use a face mask and careful hand washing to prevent spread of the virus.  Increase the usage of your inhaler if you have asthma.   Do not use any tobacco products, including cigarettes, chewing tobacco, or electronic cigarettes. If you need help quitting, ask your health care provider. PREVENTION  The best way to protect yourself from getting a cold is to practice good hygiene.   Avoid oral or hand contact with people with cold   symptoms.   Wash your hands often if contact occurs.  There is no clear evidence that vitamin C, vitamin E, echinacea, or exercise reduces the chance of developing a cold. However, it is always recommended to get plenty of rest, exercise, and practice good nutrition.  SEEK MEDICAL CARE IF:   You are getting worse rather than better.   Your symptoms are not controlled by medicine.   You have chills.  You have worsening shortness of breath.  You have brown or red mucus.  You have yellow or brown nasal  discharge.  You have pain in your face, especially when you bend forward.  You have a fever.  You have swollen neck glands.  You have pain while swallowing.  You have white areas in the back of your throat. SEEK IMMEDIATE MEDICAL CARE IF:   You have severe or persistent:  Headache.  Ear pain.  Sinus pain.  Chest pain.  You have chronic lung disease and any of the following:  Wheezing.  Prolonged cough.  Coughing up blood.  A change in your usual mucus.  You have a stiff neck.  You have changes in your:  Vision.  Hearing.  Thinking.  Mood. MAKE SURE YOU:   Understand these instructions.  Will watch your condition.  Will get help right away if you are not doing well or get worse.   This information is not intended to replace advice given to you by your health care provider. Make sure you discuss any questions you have with your health care provider.   Document Released: 11/29/2000 Document Revised: 10/20/2014 Document Reviewed: 09/10/2013 Elsevier Interactive Patient Education 2016 Elsevier Inc.  

## 2015-05-18 NOTE — Progress Notes (Signed)
[facility] [address], [place] [address] [phone]  Date:  05/18/2015   Name:  Amber Noble   DOB:  1961/02/20   MRN:  [id number]  PCP:  No PCP Per Patient   Chief Complaint  Patient presents with  . Sore Throat    x 5 days  . Cough    x 5 days  . Sinusitis    x 5 days     History of Present Illness:  Amber Noble is a 54 y.o. female patient who presents to [facility] for cc of sinus pressure, sore throat and cough.  Patient reports that she has had cough for about 5 days.  She has had no fever, but has had stuffy nose, and a sore throat.  There is no productive mucus.  She is a smoker.  She has had sick contacts with 3 siblings with dxd strep throat from her sisters and brother-in-law.  She has had a hx of strep throat as well.  She has taken cold-eez for her symptoms.  Hydrates well with 80oz water per day.   Patient Active Problem List   Diagnosis Date Noted  . Dog bite of ankle 12/10/2014  . Lumbar radiculopathy 10/22/2014  . Peripheral vertigo 07/13/2014  . Imbalance 06/09/2014  . Abnormal gait 06/09/2014  . Rupture of distal biceps tendon 02/20/2014  . Pain in thoracic spine 02/20/2014  . 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 Hypertension   . Hyperlipidemia   . Depression   . GERD (gastroesophageal reflux disease)     Past Surgical History  Procedure Laterality Date  . Back surgery    . Abdominal hysterectomy      Social History  Substance Use Topics  . Smoking status: Current Every Day Smoker  . Smokeless tobacco: None  . Alcohol Use: No    Family History  Problem Relation Age of Onset  . Hypertension Mother     Allergies  Allergen Reactions  . Contrast Media [Iodinated Diagnostic Agents] Hives and Swelling    Throat  13 hour prep  . Vicodin [Hydrocodone-Acetaminophen] Itching    Medication list has been reviewed and updated.  Current Outpatient Prescriptions on  File Prior to Visit  Medication Sig Dispense Refill  . ALPRAZolam (XANAX) 1 MG tablet Take 1 mg by mouth as needed for anxiety.    . gabapentin (NEURONTIN) 400 MG capsule Take 1 capsule (400 mg total) by mouth 4 (four) times daily. 120 capsule 3  . hydrochlorothiazide (HYDRODIURIL) 25 MG tablet Take 25 mg by mouth daily.    . methocarbamol (ROBAXIN) 500 MG tablet Take 1 tablet (500 mg total) by mouth 3 (three) times daily. 90 tablet 0  . omeprazole (PRILOSEC) 40 MG capsule Take 40 mg by mouth daily.    Marland Kitchen PARoxetine (PAXIL) 40 MG tablet Take 40 mg by mouth every morning.    . pravastatin (PRAVACHOL) 40 MG tablet Take 40 mg by mouth daily.    . naproxen sodium (ANAPROX DS) 550 MG tablet Take 1 tablet (550 mg total) by mouth 2 (two) times daily with a meal. (Patient not taking: Reported on 05/18/2015) 40 tablet 0  . traMADol (ULTRAM) 50 MG tablet Take 1 tablet (50 mg total) by mouth every 8 (eight) hours as needed. (Patient not taking: Reported on 05/18/2015) 60 tablet 0   No current facility-administered medications on file prior to visit.    ROS ROS otherwise  unremarkable unless listed above.  Physical Examination: BP 187/90 mmHg  Pulse 79  Temp(Src) 98.5 F (36.9 C) (Oral)  Resp 16  Ht 5' 4.25" (1.632 m)  Wt 161 lb 12.8 oz (73.392 kg)  BMI 27.56 kg/m2  SpO2 95%  LMP 07/01/2011 Ideal Body Weight: Weight in (lb) to have BMI = 25: 146.5  Physical Exam  Constitutional: She is oriented to person, place, and time. She appears well-developed and well-nourished. No distress.  HENT:  Head: Normocephalic and atraumatic.  Right Ear: Tympanic membrane, external ear and ear canal normal.  Left Ear: Tympanic membrane, external ear and ear canal normal.  Nose: Mucosal edema and rhinorrhea present. Right sinus exhibits no maxillary sinus tenderness and no frontal sinus tenderness. Left sinus exhibits no maxillary sinus tenderness and no frontal sinus tenderness.  Mouth/Throat: No uvula swelling.  Posterior oropharyngeal edema and posterior oropharyngeal erythema (mild erythema) present. No oropharyngeal exudate.  Eyes: Conjunctivae and EOM are normal. Pupils are equal, round, and reactive to light.  Cardiovascular: Normal rate and regular rhythm.  Exam reveals no gallop, no distant heart sounds and no friction rub.   No murmur heard. Pulmonary/Chest: Effort normal. No respiratory distress. She has no decreased breath sounds. She has no wheezes. She has no rhonchi.  Lymphadenopathy:       Head (right side): No submandibular, no tonsillar, no preauricular and no posterior auricular adenopathy present.       Head (left side): No submandibular, no tonsillar, no preauricular and no posterior auricular adenopathy present.  Neurological: She is alert and oriented to person, place, and time.  Skin: She is not diaphoretic.  Psychiatric: She has a normal mood and affect. Her behavior is normal.     Assessment and Plan: [name] is a 54 y.o. female who is here today for nasal pressure, cough, and sore throat.  Appears to be URI, with viral etiology.  However due to such close contact with strep throat, I will give her azithromycin at this time.   1. Upper respiratory symptom - ipratropium (ATROVENT) 0.03 % nasal spray; Place 2 sprays into both nostrils 2 (two) times daily.  Dispense: 30 mL; Refill: 0 - Guaifenesin (MUCINEX MAXIMUM STRENGTH) 1200 MG TB12; Take 1 tablet (1,200 mg total) by mouth every 12 (twelve) hours as needed.  Dispense: 14 tablet; Refill: 1 - promethazine-dextromethorphan (PROMETHAZINE-DM) 6.25-15 MG/5ML syrup; Take 5 mLs by mouth at bedtime as needed for cough.  Dispense: 80 mL; Refill: 0 - azithromycin (ZITHROMAX) 250 MG tablet; Take 2 tabs PO x 1 dose, then 1 tab PO QD x 4 days  Dispense: 6 tablet; Refill: 0  2. Strep throat exposure - ipratropium (ATROVENT) 0.03 % nasal spray; Place 2 sprays into both nostrils 2 (two) times daily.  Dispense: 30 mL; Refill: 0 -  Guaifenesin (MUCINEX MAXIMUM STRENGTH) 1200 MG TB12; Take 1 tablet (1,200 mg total) by mouth every 12 (twelve) hours as needed.  Dispense: 14 tablet; Refill: 1 - azithromycin (ZITHROMAX) 250 MG tablet; Take 2 tabs PO x 1 dose, then 1 tab PO QD x 4 days  Dispense: 6 tablet; Refill: 0   Trena PlattStephanie English, PA-C [facility] 05/18/2015 9:20 PM

## 2015-05-19 ENCOUNTER — Other Ambulatory Visit: Payer: Self-pay | Admitting: Family Medicine

## 2015-05-19 NOTE — Telephone Encounter (Signed)
Refill done.  

## 2015-05-20 ENCOUNTER — Other Ambulatory Visit: Payer: Self-pay | Admitting: Physician Assistant

## 2015-05-20 ENCOUNTER — Encounter: Payer: Self-pay | Admitting: Physician Assistant

## 2015-05-20 ENCOUNTER — Telehealth: Payer: Self-pay

## 2015-05-20 DIAGNOSIS — R059 Cough, unspecified: Secondary | ICD-10-CM

## 2015-05-20 DIAGNOSIS — R05 Cough: Secondary | ICD-10-CM

## 2015-05-20 MED ORDER — HYDROCOD POLST-CPM POLST ER 10-8 MG/5ML PO SUER: 5.0000 mL | Freq: Every evening | ORAL | Status: AC | PRN

## 2015-05-20 NOTE — Telephone Encounter (Signed)
The patient called to ask for a different cough syrup.  She has been taking the promethazine-dextromethorphan (PROMETHAZINE-DM) 6.25-15 MG/5ML syrup but it has not been helping her at all.  She hasn't been able to sleep at night due to the coughing.  She said she has an allergy to hydrocodone, but it only causes itching after ~3 days of taking it.  She said she would be willing to take a cough syrup with this ingredient for a couple days if it would help her sleep at night.  Please advise, thank you.  She sent a MyChart message to StanleyStephanie, but she wanted to leave a phone message also.  She gets off work at [employer]6pm and can pick up the written rx if it can be done tonight.  CB#: [phone]

## 2015-05-20 NOTE — Telephone Encounter (Signed)
Contacted patient about cough medication.  Changed to tussionex.

## 2015-05-21 NOTE — Telephone Encounter (Signed)
Spoke with patient, and had changed her medication to tussionex last night.  She came to pick it up last night 05/20/2015.

## 2015-06-15 ENCOUNTER — Other Ambulatory Visit: Payer: Self-pay | Admitting: Physician Assistant

## 2015-06-23 ENCOUNTER — Other Ambulatory Visit: Payer: Self-pay | Admitting: Family Medicine

## 2015-06-23 NOTE — Telephone Encounter (Signed)
Refill done.  

## 2015-07-27 ENCOUNTER — Other Ambulatory Visit: Payer: Self-pay | Admitting: Family Medicine

## 2015-07-27 NOTE — Telephone Encounter (Signed)
Refill done.  

## 2015-09-04 ENCOUNTER — Other Ambulatory Visit: Payer: Self-pay | Admitting: Family Medicine

## 2015-09-06 NOTE — Telephone Encounter (Signed)
Refill done.  

## 2015-09-15 ENCOUNTER — Ambulatory Visit (INDEPENDENT_AMBULATORY_CARE_PROVIDER_SITE_OTHER): Payer: Managed Care, Other (non HMO) | Admitting: Family Medicine

## 2015-09-15 VITALS — BP 152/102 | HR 67 | Temp 98.4°F | Resp 16 | Ht 64.0 in | Wt 170.6 lb

## 2015-09-15 DIAGNOSIS — R05 Cough: Secondary | ICD-10-CM | POA: Diagnosis not present

## 2015-09-15 DIAGNOSIS — J0101 Acute recurrent maxillary sinusitis: Secondary | ICD-10-CM | POA: Diagnosis not present

## 2015-09-15 DIAGNOSIS — R059 Cough, unspecified: Secondary | ICD-10-CM

## 2015-09-15 DIAGNOSIS — J029 Acute pharyngitis, unspecified: Secondary | ICD-10-CM | POA: Diagnosis not present

## 2015-09-15 MED ORDER — AMOXICILLIN-POT CLAVULANATE 875-125 MG PO TABS: 1.0000 | ORAL_TABLET | Freq: Two times a day (BID) | ORAL | Status: DC

## 2015-09-15 MED ORDER — HYDROCODONE-HOMATROPINE 5-1.5 MG/5ML PO SYRP: 5.0000 mL | ORAL_SOLUTION | Freq: Three times a day (TID) | ORAL | Status: DC | PRN

## 2015-09-15 NOTE — Progress Notes (Signed)
By signing my name below, I, Stann Ore, attest that this documentation has been prepared under the direction and in the presence of Elvina Sidle, MD. Electronically Signed: Stann Ore, Scribe. 09/15/2015 , 6:53 PM .  Patient was seen in room 10 .   Patient ID: Amber Noble MRN: [id number], DOB: 1960/10/14, 55 y.o. Date of Encounter: 09/15/2015  Primary Physician: No PCP Per Patient  Chief Complaint:  Chief Complaint  Patient presents with  . Dizziness    x 4-5 days  . Sore Throat    x 4-5 days  . Cough    x 4-5 days    HPI:  Amber Noble is a 55 y.o. female who [facility] complaining of dizziness with sore throat and cough that started 4-5 days ago. She also reports having sinus pressure, rhinorrhea and congestion. She wasn't able to sleep well due to the cough. She received a flu shot this year.   She has an appointment with her PCP next week to recheck HTN.   She is a former smoker, quit a month ago.  She works in Audiological [employer].   Past Medical History  Diagnosis Date  . Hypertension   . Hyperlipidemia   . Depression   . GERD (gastroesophageal reflux disease)      Home Meds: Prior to Admission medications   Medication Sig Start Date End Date Taking? Authorizing Provider  ALPRAZolam Prudy Feeler) 1 MG tablet Take 1 mg by mouth as needed for anxiety.   Yes Historical Provider, MD  gabapentin (NEURONTIN) 400 MG capsule TAKE 1 CAPSULE FOUR TIMES A DAY 09/06/15  Yes Judi Saa, DO  hydrochlorothiazide (HYDRODIURIL) 25 MG tablet Take 25 mg by mouth daily.   Yes Historical Provider, MD  omeprazole (PRILOSEC) 40 MG capsule Take 40 mg by mouth daily.   Yes Historical Provider, MD  PARoxetine (PAXIL) 40 MG tablet Take 40 mg by mouth every morning.   Yes Historical Provider, MD  pravastatin (PRAVACHOL) 40 MG tablet Take 40 mg by mouth daily.   Yes Historical Provider, MD  ipratropium (ATROVENT) 0.03 % nasal spray Place 2 sprays into both  nostrils 2 (two) times daily. Patient not taking: Reported on 09/15/2015 05/18/15   Collie Siad English, PA  methocarbamol (ROBAXIN) 500 MG tablet TAKE 1 TABLET THREE TIMES A DAY Patient not taking: Reported on 09/15/2015 07/27/15   Judi Saa, DO  naproxen sodium (ANAPROX DS) 550 MG tablet Take 1 tablet (550 mg total) by mouth 2 (two) times daily with a meal. Patient not taking: Reported on 05/18/2015 12/07/14 12/07/15  Carmelina Dane, MD  traMADol (ULTRAM) 50 MG tablet Take 1 tablet (50 mg total) by mouth every 8 (eight) hours as needed. Patient not taking: Reported on 05/18/2015 04/08/15   Judi Saa, DO    Allergies:  Allergies  Allergen Reactions  . Contrast Media [Iodinated Diagnostic Agents] Hives and Swelling    Throat  13 hour prep  . Vicodin [Hydrocodone-Acetaminophen] Itching    Social History   Social History  . Marital Status: Married    Spouse Name: N/A  . Number of Children: N/A  . Years of Education: N/A   Occupational History  . Not on file.   Social History Main Topics  . Smoking status: Current Every Day Smoker  . Smokeless tobacco: Not on file  . Alcohol Use: No  . Drug Use: No  . Sexual Activity: Not on file   Other Topics Concern  .  Not on file   Social History Narrative     Review of Systems: Constitutional: negative for fever, chills, night sweats, weight changes; positive for fatigue HEENT: negative for vision changes, hearing loss, epistaxis; positive for sinus pressure, sore throat, congestion, rhinorrhea Cardiovascular: negative for chest pain or palpitations Respiratory: negative for hemoptysis, wheezing, shortness of breath; positive for cough Abdominal: negative for abdominal pain, nausea, vomiting, diarrhea, or constipation Dermatological: negative for rash Neurologic: negative for headache, or syncope; positive for dizziness All other systems reviewed and are otherwise negative with the exception to those above and in the  HPI.  Physical Exam:  Blood pressure 152/102, pulse 67, temperature 98.4 F (36.9 C), temperature source Oral, resp. rate 16, height 5\' 4"  (1.626 m), weight 170 lb 9.6 oz (77.384 kg), last menstrual period 07/01/2011, SpO2 98 %., Body mass index is 29.27 kg/(m^2). General: Well developed, well nourished, in no acute distress. Head: Normocephalic, atraumatic, eyes without discharge, sclera non-icteric, nares are without discharge. throat very red; thick mucus in nasal passages Neck: Supple. No thyromegaly. Full ROM. No lymphadenopathy. Lungs: Clear bilaterally to auscultation without rales, or rhonchi. Breathing is unlabored; expiratory wheezes Heart: RRR with S1 S2. No murmurs, rubs, or gallops appreciated. Abdomen: Soft, non-tender, non-distended with normoactive bowel sounds. No hepatomegaly. No rebound/guarding. No obvious abdominal masses. Msk:  Strength and tone normal for age. Extremities/Skin: Warm and dry. No clubbing or cyanosis. No edema. No rashes or suspicious lesions. Neuro: Alert and oriented X 3. Moves all extremities spontaneously. Gait is normal. CNII-XII grossly in tact. Psych:  Responds to questions appropriately with a normal affect.   Labs:  ASSESSMENT AND PLAN:  55 y.o. year old female with Acute recurrent maxillary sinusitis - Plan: amoxicillin-clavulanate (AUGMENTIN) 875-125 MG tablet  Acute pharyngitis, unspecified etiology - Plan: amoxicillin-clavulanate (AUGMENTIN) 875-125 MG tablet  Cough - Plan: HYDROcodone-homatropine (HYCODAN) 5-1.5 MG/5ML syrup  This chart was scribed in my presence and reviewed by me personally.     Signed, Elvina SidleKurt Arnol Mcgibbon, MD 09/15/2015 6:53 PM

## 2015-09-15 NOTE — Patient Instructions (Addendum)

## 2015-11-09 ENCOUNTER — Other Ambulatory Visit: Payer: Self-pay | Admitting: Orthopedic Surgery

## 2015-11-09 DIAGNOSIS — M25522 Pain in left elbow: Secondary | ICD-10-CM

## 2015-11-17 ENCOUNTER — Other Ambulatory Visit: Payer: No Typology Code available for payment source

## 2015-11-18 ENCOUNTER — Ambulatory Visit
Admission: RE | Admit: 2015-11-18 | Discharge: 2015-11-18 | Disposition: A | Discharge status: Home / Self Care | Payer: Managed Care, Other (non HMO) | Source: Ambulatory Visit | Attending: Orthopedic Surgery | Admitting: Orthopedic Surgery

## 2015-11-18 DIAGNOSIS — M25522 Pain in left elbow: Secondary | ICD-10-CM

## 2015-12-08 ENCOUNTER — Other Ambulatory Visit: Payer: Self-pay | Admitting: Orthopaedic Surgery

## 2015-12-08 DIAGNOSIS — M4716 Other spondylosis with myelopathy, lumbar region: Secondary | ICD-10-CM

## 2015-12-15 ENCOUNTER — Ambulatory Visit
Admission: RE | Admit: 2015-12-15 | Discharge: 2015-12-15 | Disposition: A | Discharge status: Home / Self Care | Payer: Managed Care, Other (non HMO) | Source: Ambulatory Visit | Attending: Orthopaedic Surgery | Admitting: Orthopaedic Surgery

## 2015-12-15 DIAGNOSIS — M4716 Other spondylosis with myelopathy, lumbar region: Secondary | ICD-10-CM

## 2015-12-23 ENCOUNTER — Other Ambulatory Visit: Payer: Self-pay | Admitting: *Deleted

## 2015-12-23 MED ORDER — GABAPENTIN 400 MG PO CAPS
ORAL_CAPSULE | ORAL | Status: DC
Start: 2015-12-23 — End: 2017-10-26

## 2015-12-23 NOTE — Telephone Encounter (Signed)
Refill done.  

## 2016-01-19 ENCOUNTER — Encounter: Payer: Self-pay | Admitting: Gynecologic Oncology

## 2016-01-19 ENCOUNTER — Ambulatory Visit: Payer: Managed Care, Other (non HMO) | Attending: Gynecologic Oncology | Admitting: Gynecologic Oncology

## 2016-01-19 VITALS — BP 145/78 | HR 67 | Temp 98.2°F | Resp 20 | Ht 64.0 in | Wt 164.3 lb

## 2016-01-19 DIAGNOSIS — E785 Hyperlipidemia, unspecified: Secondary | ICD-10-CM | POA: Diagnosis not present

## 2016-01-19 DIAGNOSIS — Z9889 Other specified postprocedural states: Secondary | ICD-10-CM | POA: Diagnosis not present

## 2016-01-19 DIAGNOSIS — R102 Pelvic and perineal pain: Secondary | ICD-10-CM | POA: Diagnosis not present

## 2016-01-19 DIAGNOSIS — F329 Major depressive disorder, single episode, unspecified: Secondary | ICD-10-CM | POA: Diagnosis not present

## 2016-01-19 DIAGNOSIS — N83201 Unspecified ovarian cyst, right side: Secondary | ICD-10-CM

## 2016-01-19 DIAGNOSIS — K219 Gastro-esophageal reflux disease without esophagitis: Secondary | ICD-10-CM | POA: Diagnosis not present

## 2016-01-19 DIAGNOSIS — Z79899 Other long term (current) drug therapy: Secondary | ICD-10-CM | POA: Diagnosis not present

## 2016-01-19 DIAGNOSIS — F419 Anxiety disorder, unspecified: Secondary | ICD-10-CM | POA: Insufficient documentation

## 2016-01-19 DIAGNOSIS — Z888 Allergy status to other drugs, medicaments and biological substances status: Secondary | ICD-10-CM | POA: Insufficient documentation

## 2016-01-19 DIAGNOSIS — I10 Essential (primary) hypertension: Secondary | ICD-10-CM | POA: Diagnosis not present

## 2016-01-19 DIAGNOSIS — F17200 Nicotine dependence, unspecified, uncomplicated: Secondary | ICD-10-CM | POA: Diagnosis not present

## 2016-01-19 DIAGNOSIS — M25551 Pain in right hip: Secondary | ICD-10-CM | POA: Diagnosis not present

## 2016-01-19 NOTE — Patient Instructions (Signed)
Dr. Ophelia Charter recommendation is for a repeat ultrasound in six weeks.  Follow up with Dr. Mora Appl about removal of your ovaries for pain related issues.  Please call for any questions or concerns.

## 2016-01-19 NOTE — Progress Notes (Signed)
GYNECOLOGIC ONCOLOGY NEW PATIENT CONSULTATION  Date of Service: 01/19/16, 1:00 pm Referring Provider: Delila Spence. Mora Appl, MD Consulting Provider: Junie Spencer. Chestine Spore, MD  HISTORY OF PRESENT ILLNESS: Amber Noble is a 55 y.o. woman who is seen in consultation at the request of Dr. Mora Appl for evaluation of a right adnexal cyst.  Ms. Campas endorses a two month history of pelvic pain. She describes the pain as shooting. She says it is in her groin and radiates down her leg. She notes it is constant and disrupts her daily activity.She established care with Dr. Neil Crouch and was referred for an ultrasound as her exam was limited by guarding. This ultrasound showed a 1.2cm right ovarian cyst with a normal left ovary and no free fluid. She has previously undergone hysterectomy. She does endorse a history of abnormal pap smears, but has since undergone a complete hysterectomy. She has no family history of breast or ovarian cancer. She does have a history of chronic back pain for which she takes meloxicam as well as neurontin. She also has a history of depression/anxiety for which is takes xanax, trazadone, and paxil.  PAST MEDICAL HISTORY: Past Medical History:  Diagnosis Date  . Depression   . GERD (gastroesophageal reflux disease)   . Hyperlipidemia   . Hypertension     PAST SURGICAL HISTORY: Past Surgical History:  Procedure Laterality Date  . ABDOMINAL HYSTERECTOMY    . BACK SURGERY      OB/GYN HISTORY: G0 Age at menopause: 2003, surgical Hx of HRT: denies Hx of STDs: denies History of abnormal pap smears: yes as per HPI Last mammogram: 2017, BIRADS1  MEDICATIONS:  Current Outpatient Prescriptions:  .  ALPRAZolam (XANAX) 1 MG tablet, Take 1 mg by mouth as needed for anxiety., Disp: , Rfl:  .  amoxicillin-clavulanate (AUGMENTIN) 875-125 MG tablet, Take 1 tablet by mouth 2 (two) times daily., Disp: 20 tablet, Rfl: 0 .  gabapentin (NEURONTIN) 400 MG capsule, TAKE 1 CAPSULE FOUR TIMES A DAY, Disp: 360  capsule, Rfl: 0 .  hydrochlorothiazide (HYDRODIURIL) 25 MG tablet, Take 25 mg by mouth daily., Disp: , Rfl:  .  HYDROcodone-homatropine (HYCODAN) 5-1.5 MG/5ML syrup, Take 5 mLs by mouth every 8 (eight) hours as needed for cough., Disp: 120 mL, Rfl: 0 .  ipratropium (ATROVENT) 0.03 % nasal spray, Place 2 sprays into both nostrils 2 (two) times daily. (Patient not taking: Reported on 09/15/2015), Disp: 30 mL, Rfl: 0 .  methocarbamol (ROBAXIN) 500 MG tablet, TAKE 1 TABLET THREE TIMES A DAY (Patient not taking: Reported on 09/15/2015), Disp: 90 tablet, Rfl: 0 .  omeprazole (PRILOSEC) 40 MG capsule, Take 40 mg by mouth daily., Disp: , Rfl:  .  PARoxetine (PAXIL) 40 MG tablet, Take 40 mg by mouth every morning., Disp: , Rfl:  .  pravastatin (PRAVACHOL) 40 MG tablet, Take 40 mg by mouth daily., Disp: , Rfl:  .  traMADol (ULTRAM) 50 MG tablet, Take 1 tablet (50 mg total) by mouth every 8 (eight) hours as needed. (Patient not taking: Reported on 05/18/2015), Disp: 60 tablet, Rfl: 0  ALLERGIES: Allergies  Allergen Reactions  . Contrast Media [Iodinated Diagnostic Agents] Hives and Swelling    Throat  13 hour prep  . Vicodin [Hydrocodone-Acetaminophen] Itching    FAMILY HISTORY: Family History  Problem Relation Age of Onset  . Hypertension Mother     SOCIAL HISTORY: Social History   Social History  . Marital status: Married    Spouse name: N/A  . Number of children:  N/A  . Years of education: N/A   Occupational History  . Not on file.   Social History Main Topics  . Smoking status: Current Every Day Smoker  . Smokeless tobacco: Not on file  . Alcohol use No  . Drug use: No  . Sexual activity: Not on file   Other Topics Concern  . Not on file   Social History Narrative  . No narrative on file    REVIEW OF SYSTEMS: Complete 10-system review is negative except for the following: As per HPI.  PHYSICAL EXAM: BP (!) 145/78 (BP Location: Left Arm, Patient Position: Sitting)   Pulse  67   Temp 98.2 F (36.8 C) (Oral)   Resp 20   Ht 5\' 4"  (1.626 m)   Wt 164 lb 4.8 oz (74.5 kg)   LMP 07/01/2011   SpO2 100%   BMI 28.20 kg/m  Exam declined by patient today.  LABORATORY AND RADIOLOGIC DATA: Medical records were reviewed to synthesize the above history, along with the history and physical obtained during the visit.  Outside laboratory, pathology, and imaging reports were reviewed, with pertinent results below.  I personally reviewed the outside images.  Ultrasound with 1.2cm right ovarian cyst with small internal projection. No free fluid. Normal left ovary. CA 125: 4 CEA: 3.2 (smoker) CA 19-9: 5  ASSESSMENT AND PLAN: Amber Noble is a 55 y.o. woman with 1.2 cm right ovarian cyst and right sided pelvic, back, groin and leg pain with normal CA 125.  I discussed at length with Ms. Nierman today that my oncologic recommendation would be for repeat imaging in 6 weeks given the size of this cyst and normal tumor markers. We discussed risks related to surgery and general anesthesia and that she does not currently have a strong indication for surgery from a cancer standpoint. She strongly desires BSO to treat her pelvic pain. She was advised that if she desired to pursue this surgical route her surgery could be performed by her usual gynecologist. She was upset with my assessment in clinic today as she felt her case was determined by Dr. Andrey Farmer to warrant surgical intervention. The patient did walk into clinic today without an appointment and get worked in. Following our discussion regarding repeat imaging in 6 weeks the patient declined to be examined and left the clinic. At the conclusion of our discussion today the patient was recommended to follow up with Dr. Neil Crouch to readdress the role of surgery in the treatment of her pelvic pain.   A copy of this note was sent to the patient's referring provider.   Junie Spencer. Chestine Spore, MD

## 2016-01-24 ENCOUNTER — Ambulatory Visit: Payer: Managed Care, Other (non HMO) | Admitting: Gynecology

## 2016-02-07 ENCOUNTER — Encounter: Payer: Self-pay | Admitting: Gynecologic Oncology

## 2016-02-07 ENCOUNTER — Ambulatory Visit: Payer: Managed Care, Other (non HMO) | Attending: Gynecologic Oncology | Admitting: Gynecologic Oncology

## 2016-02-07 DIAGNOSIS — E785 Hyperlipidemia, unspecified: Secondary | ICD-10-CM | POA: Diagnosis not present

## 2016-02-07 DIAGNOSIS — I10 Essential (primary) hypertension: Secondary | ICD-10-CM | POA: Diagnosis not present

## 2016-02-07 DIAGNOSIS — F329 Major depressive disorder, single episode, unspecified: Secondary | ICD-10-CM | POA: Diagnosis not present

## 2016-02-07 DIAGNOSIS — Z90710 Acquired absence of both cervix and uterus: Secondary | ICD-10-CM | POA: Diagnosis not present

## 2016-02-07 DIAGNOSIS — K219 Gastro-esophageal reflux disease without esophagitis: Secondary | ICD-10-CM | POA: Insufficient documentation

## 2016-02-07 DIAGNOSIS — Z885 Allergy status to narcotic agent status: Secondary | ICD-10-CM | POA: Insufficient documentation

## 2016-02-07 DIAGNOSIS — F17210 Nicotine dependence, cigarettes, uncomplicated: Secondary | ICD-10-CM | POA: Insufficient documentation

## 2016-02-07 DIAGNOSIS — Z91041 Radiographic dye allergy status: Secondary | ICD-10-CM | POA: Diagnosis not present

## 2016-02-07 DIAGNOSIS — Z8249 Family history of ischemic heart disease and other diseases of the circulatory system: Secondary | ICD-10-CM | POA: Insufficient documentation

## 2016-02-07 DIAGNOSIS — N83201 Unspecified ovarian cyst, right side: Secondary | ICD-10-CM | POA: Insufficient documentation

## 2016-02-07 DIAGNOSIS — R200 Anesthesia of skin: Secondary | ICD-10-CM | POA: Diagnosis not present

## 2016-02-07 NOTE — Patient Instructions (Signed)
Preparing for your Surgery  Plan for surgery on September 26 , 2017  with Dr. Adolphus BirchwoodEmma Noble for a robotic assisted bilateral salpingo-oophorectomy.  Pre-operative Testing -You will receive a phone call from presurgical testing at [facility] to arrange for a pre-operative testing appointment before your surgery.  This appointment normally occurs one to two weeks before your scheduled surgery.   -Bring your insurance card, copy of an advanced directive if applicable, medication list  -At that visit, you will be asked to sign a consent for a possible blood transfusion in case a transfusion becomes necessary during surgery.  The need for a blood transfusion is rare but having consent is a necessary part of your care.     -You should not be taking blood thinners or aspirin at least ten days prior to surgery unless instructed by your surgeon.  Day Before Surgery at Home -You will be asked to take in a light diet the day before surgery.  Avoid carbonated beverages.  You will be advised to have nothing to eat or drink after midnight the evening before.     Eat a light diet the day before surgery.  Examples including soups, broths,  toast, yogurt, mashed potatoes.  Things to avoid include carbonated beverages  (fizzy beverages), raw fruits and raw vegetables, or beans.    If your bowels are filled with gas, your surgeon will have difficulty  visualizing your pelvic organs which increases your surgical risks.  Your role in recovery Your role is to become active as soon as directed by your doctor, while still giving yourself time to heal.  Rest when you feel tired. You will be asked to do the following in order to speed your recovery:  - Cough and breathe deeply. This helps toclear and expand your lungs and can prevent pneumonia. You may be given a spirometer to practice deep breathing. A staff member will show you how to use the spirometer. - Do mild physical activity. Walking or  moving your legs help your circulation and body functions return to normal. A staff member will help you when you try to walk and will provide you with simple exercises. Do not try to get up or walk alone the first time. - Actively manage your pain. Managing your pain lets you move in comfort. We will ask you to rate your pain on a scale of zero to 10. It is your responsibility to tell your doctor or nurse where and how much you hurt so your pain can be treated.  Special Considerations -If you are diabetic, you may be placed on insulin after surgery to have closer control over your blood sugars to promote healing and recovery.  This does not mean that you will be discharged on insulin.  If applicable, your oral antidiabetics will be resumed when you are tolerating a solid diet.  -Your final pathology results from surgery should be available by the Friday after surgery and the results will be relayed to you when available.  Blood Transfusion Information WHAT IS A BLOOD TRANSFUSION? A transfusion is the replacement of blood or some of its parts. Blood is made up of multiple cells which provide different functions.  Red blood cells carry oxygen and are used for blood loss replacement.  White blood cells fight against infection.  Platelets control bleeding.  Plasma helps clot blood.  Other blood products are available for specialized needs, such as hemophilia or other clotting disorders. BEFORE THE TRANSFUSION  Who gives blood  for transfusions?   You may be able to donate blood to be used at a later date on yourself (autologous donation).  Relatives can be asked to donate blood. This is generally not any safer than if you have received blood from a stranger. The same precautions are taken to ensure safety when a relative's blood is donated.  Healthy volunteers who are fully evaluated to make sure their blood is safe. This is blood bank blood. Transfusion therapy is the safest it has ever  been in the practice of medicine. Before blood is taken from a donor, a complete history is taken to make sure that person has no history of diseases nor engages in risky social behavior (examples are intravenous drug use or sexual activity with multiple partners). The donor's travel history is screened to minimize risk of transmitting infections, such as malaria. The donated blood is tested for signs of infectious diseases, such as HIV and hepatitis. The blood is then tested to be sure it is compatible with you in order to minimize the chance of a transfusion reaction. If you or a relative donates blood, this is often done in anticipation of surgery and is not appropriate for emergency situations. It takes many days to process the donated blood. RISKS AND COMPLICATIONS Although transfusion therapy is very safe and saves many lives, the main dangers of transfusion include:   Getting an infectious disease.  Developing a transfusion reaction. This is an allergic reaction to something in the blood you were given. Every precaution is taken to prevent this. The decision to have a blood transfusion has been considered carefully by your caregiver before blood is given. Blood is not given unless the benefits outweigh the risks.

## 2016-02-07 NOTE — Progress Notes (Signed)
GYNECOLOGIC ONCOLOGY FOLLOW-UP VISIT  Referring Provider: Delila SpenceWalda S. Mora ApplPinn, MD  ASSESSMENT AND PLAN: Amber Noble is a 55 y.o. woman with 1.2 cm right ovarian cyst and right sided pelvic, back, groin and leg pain with normal CA 125.  She is extremely anxious about the possibility of ovarian cancer, and strongly desires bilateral oophorectomy as a means to reduce this risk/rule out occult malignancy.  I had a lengthy discussion with Ms Amber Noble that I have a very low suspicion for malignancy given the small size of this mass, the characteristics on imaging and the normal tumor markers. I explained that a reasonable approach that would avoid the risks of surgery would be close observation. I also explained that based on my exam findings and the nature of the cyst, I do not believe it is the source of her pelvic pain and I do not believe that BSO will result in improvement of her pain. I discussed that surgery may in fact increase her pain if adhesions develop which are symptomatic. I suspect that she has chronic pelvic pain secondary to pelvic floor dysfunction and may benefit from pelvic PT. I discussed that removal of non-malignant ovaries in women at an age younger than 7865 is associated with increased hazard ratio for death (otherwise stated, an earlier life-expectancy) typically from cardiovascular events. We discussed that as a smoker, this is particularly relevant for her and her greatest risk for death is from MI/CVA or other vascular event, rather than from ovarian cancer (she has an average risk for this of 1 in 6660).  I discussed that surgical removal of ovaries is associated with surgical risks including  bleeding, infection, damage to internal organs (such as bladder,ureters, bowels), blood clot, reoperation and rehospitalization.   After hearing this counseling, Ms Amber Noble feels extremely strongly that she desires robotic assisted BSO. We will perform frozen section on the ovary at the time of  surgery and if malignancy is performed perform staging procedures.   HISTORY OF PRESENT ILLNESS: Amber Noble is a 55 y.o. woman who was initially seen in consultation at the request of Dr. Mora ApplPinn for evaluation of a right adnexal cyst in early August by my partner, Dr Livia SnellenLeslie Clark.   Ms. Amber Noble endorses a two month history of pelvic pain. She describes the pain as shooting. She says it is in her groin and radiates down her leg. She notes it is constant and disrupts her daily activity.She established care with Dr. Neil CrouchWinn and was referred for an ultrasound as her exam was limited by guarding. This ultrasound showed a 1.2cm right ovarian cyst with a normal left ovary and no free fluid. She has previously undergone hysterectomy. She does endorse a history of abnormal pap smears, but has since undergone a complete hysterectomy. She has no family history of breast or ovarian cancer. She does have a history of chronic back pain for which she takes meloxicam as well as neurontin. She also has a history of depression/anxiety for which is takes xanax, trazadone, and paxil. She reports dyspareunia.  Interval Hx:  She was seen as consultation by Dr Chestine Sporelark in August, 2017 and was recommended close surveillance. The patient feels this in an unacceptable option. Her right groin pain is stable x 6 months. She has persistent left sciatica, weakness and numbness in the left foot (chronic).   PAST MEDICAL HISTORY: Past Medical History:  Diagnosis Date  . Depression   . GERD (gastroesophageal reflux disease)   . Hyperlipidemia   . Hypertension  PAST SURGICAL HISTORY: Past Surgical History:  Procedure Laterality Date  . ABDOMINAL HYSTERECTOMY    . BACK SURGERY      OB/GYN HISTORY: G0 Age at menopause: 2003, surgical Hx of HRT: denies Hx of STDs: denies History of abnormal pap smears: yes as per HPI Last mammogram: 2017, BIRADS1  MEDICATIONS:  Current Outpatient Prescriptions:  .  gabapentin  (NEURONTIN) 400 MG capsule, TAKE 1 CAPSULE FOUR TIMES A DAY, Disp: 360 capsule, Rfl: 0 .  hydrochlorothiazide (HYDRODIURIL) 25 MG tablet, Take 25 mg by mouth daily., Disp: , Rfl:  .  methocarbamol (ROBAXIN) 500 MG tablet, TAKE 1 TABLET THREE TIMES A DAY, Disp: 90 tablet, Rfl: 0 .  omeprazole (PRILOSEC) 40 MG capsule, Take 40 mg by mouth daily., Disp: , Rfl:  .  PARoxetine (PAXIL) 40 MG tablet, Take 40 mg by mouth every morning., Disp: , Rfl:  .  pravastatin (PRAVACHOL) 40 MG tablet, Take 40 mg by mouth daily., Disp: , Rfl:  .  ALPRAZolam (XANAX) 1 MG tablet, Take 1 mg by mouth as needed for anxiety., Disp: , Rfl:   ALLERGIES: Allergies  Allergen Reactions  . Contrast Media [Iodinated Diagnostic Agents] Hives and Swelling    Throat  13 hour prep  . Vicodin [Hydrocodone-Acetaminophen] Itching    FAMILY HISTORY: Family History  Problem Relation Age of Onset  . Hypertension Mother     SOCIAL HISTORY: Social History   Social History  . Marital status: Married    Spouse name: N/A  . Number of children: N/A  . Years of education: N/A   Occupational History  . Not on file.   Social History Main Topics  . Smoking status: Current Every Day Smoker    Packs/day: 0.50    Types: Cigarettes  . Smokeless tobacco: Never Used  . Alcohol use Yes  . Drug use: No  . Sexual activity: Not on file   Other Topics Concern  . Not on file   Social History Narrative  . No narrative on file    REVIEW OF SYSTEMS: Complete 10-system review is negative except for the following: As per HPI.  PHYSICAL EXAM: BP (!) 161/88 (BP Location: Left Arm, Patient Position: Sitting)   Pulse 71   Temp 97.7 F (36.5 C)   Resp 18   Ht 5\' 4"  (1.626 m)   Wt 162 lb 4.8 oz (73.6 kg)   LMP 07/01/2011   SpO2 100%   BMI 27.86 kg/m  General: in no distress HEENT: no scleral icterus.  Pulm: CTAB CVS: RRR Lymph nodes: no axillary or inguinal adenopathy Abdomen/GI: soft, nontender abdomen, no masses,  multiple tattoos, no incisions. Gyn/GU: normal EFG, normal BUS and skenes, normal well supported vagina. Narrow introitus. Tenderness to palpation at external vagina/pelvic floor. Spasm of pelvic floor muscles. Generalized tenderness on bimanual examination. No adnexal masses palpable and no nodularity. Surgically absent cervix and uterus. Rectal: deferred. Musculoskeletal: no gross deformities of upper or lower extremities. No edema. Neuro: normal gait and grossly normal strength.  LABORATORY AND RADIOLOGIC DATA: Medical records were reviewed to synthesize the above history, along with the history and physical obtained during the visit.  Outside laboratory, pathology, and imaging reports were reviewed, with pertinent results below.  I personally reviewed the outside images.  Ultrasound with 1.2cm right ovarian cyst with small internal projection. No free fluid. Normal left ovary. CA 125: 4 CEA: 3.2 (smoker) CA 19-9: 5  20 minutes of face to face counseling time was spent with the patient.  Donaciano Eva, MD

## 2016-02-18 ENCOUNTER — Ambulatory Visit: Payer: Managed Care, Other (non HMO) | Admitting: Gynecologic Oncology

## 2016-02-29 ENCOUNTER — Telehealth: Payer: Self-pay | Admitting: Gynecologic Oncology

## 2016-02-29 NOTE — Telephone Encounter (Signed)
Patient left message stating her work will not allow her to have surgery and "pretty much said if you have surgery, you will lose your job."  Patient stating she would need to cancel her surgery at this time scheduled for Sept 26 and she would contact our office when her work will allow her to have surgery.  Returned call to patient and informed her that her surgery for the 26th had been cancelled and to call our office when she is able to reschedule or to call for any questions or concerns.

## 2016-03-03 ENCOUNTER — Encounter (HOSPITAL_COMMUNITY): Admission: RE | Admit: 2016-03-03 | Payer: Managed Care, Other (non HMO) | Source: Ambulatory Visit

## 2016-03-14 ENCOUNTER — Ambulatory Visit: Admit: 2016-03-14 | Payer: Managed Care, Other (non HMO) | Admitting: Gynecologic Oncology

## 2016-03-14 SURGERY — SALPINGO-OOPHORECTOMY, BILATERAL, ROBOT-ASSISTED
Anesthesia: General | Laterality: Bilateral

## 2016-11-17 HISTORY — PX: BUNIONECTOMY: SHX129

## 2016-12-12 ENCOUNTER — Encounter: Payer: Self-pay | Admitting: Internal Medicine

## 2017-01-17 ENCOUNTER — Encounter (HOSPITAL_COMMUNITY): Payer: Self-pay | Admitting: Emergency Medicine

## 2017-01-17 ENCOUNTER — Emergency Department (HOSPITAL_COMMUNITY)
Admission: EM | Admit: 2017-01-17 | Discharge: 2017-01-18 | Disposition: A | Discharge status: Home / Self Care | Payer: Managed Care, Other (non HMO) | Attending: Emergency Medicine | Admitting: Emergency Medicine

## 2017-01-17 DIAGNOSIS — Y999 Unspecified external cause status: Secondary | ICD-10-CM | POA: Diagnosis not present

## 2017-01-17 DIAGNOSIS — Y939 Activity, unspecified: Secondary | ICD-10-CM | POA: Insufficient documentation

## 2017-01-17 DIAGNOSIS — Y929 Unspecified place or not applicable: Secondary | ICD-10-CM | POA: Diagnosis not present

## 2017-01-17 DIAGNOSIS — X58XXXA Exposure to other specified factors, initial encounter: Secondary | ICD-10-CM | POA: Diagnosis not present

## 2017-01-17 DIAGNOSIS — I10 Essential (primary) hypertension: Secondary | ICD-10-CM | POA: Diagnosis not present

## 2017-01-17 DIAGNOSIS — Z79899 Other long term (current) drug therapy: Secondary | ICD-10-CM | POA: Diagnosis not present

## 2017-01-17 DIAGNOSIS — T782XXA Anaphylactic shock, unspecified, initial encounter: Secondary | ICD-10-CM | POA: Diagnosis not present

## 2017-01-17 DIAGNOSIS — T7840XA Allergy, unspecified, initial encounter: Secondary | ICD-10-CM | POA: Diagnosis present

## 2017-01-17 DIAGNOSIS — F17210 Nicotine dependence, cigarettes, uncomplicated: Secondary | ICD-10-CM | POA: Insufficient documentation

## 2017-01-17 MED ORDER — PREDNISOLONE SODIUM PHOSPHATE 25 MG/5ML PO SOLN: 50.0000 mg | Freq: Every day | ORAL | 0 refills | Status: AC

## 2017-01-17 MED ORDER — EPINEPHRINE 0.3 MG/0.3ML IJ SOAJ: 0.3000 mg | Freq: Once | INTRAMUSCULAR | 0 refills | Status: AC

## 2017-01-17 NOTE — Discharge Instructions (Signed)
Take benadryl 25mg  every 6 hours for 48 hours, ranitidine 150mg  once daily for 48hr.

## 2017-01-17 NOTE — ED Notes (Signed)
Bed: [id number] Expected date:  Expected time:  Means of arrival:  Comments: EMS 56 yo female allergic reaction-chewed raspberry gum/felt dizzy-nausea and indigestion-2 doses epi 0.3, benadryl, was hypotensive

## 2017-01-17 NOTE — ED Provider Notes (Signed)
[facility]-EMERGENCY DEPT Provider Note   CSN: [id number] Arrival date & time: 01/17/17  1933     History   Chief Complaint Chief Complaint  Patient presents with  . Allergic Reaction    HPI Amber Noble is a 56 y.o. female.  HPI   56 year old female with a history of hypertension, hyperlipidemia, depression presents with concern for allergic reaction. Patient reports that she started chewing ice Bakers raspberry sorbet chewing gum, and immediately afterwards began to develop diffuse pruritic rash, and started to feel lightheaded. She drove herself to a fast med, and as she was arriving via and feel nauseous, severely lightheaded, and began to have a near syncopal event prior to walking in. Reports that a bystander helped her get into the building, where she had another episode is severe near syncope. She was told her blood pressures were 90/60, and was given a dose of IM Decadron at the fast med and EMS was called. EMS gave patient IM epinephrine, 50 mg of Benadryl. Reports that her rash is continuing to worsen, and they gave another dose of 0.3 mg of epinephrine.  On arrival to the emergency department, patient reports that she feels significantly improved. Reports that she did have some dyspnea prior but feels it is because she was panicking. Reports now she has some continuing rash, improved itching, but reports other symptoms have resolved. No hx of anaphylaxis before.  Past Medical History:  Diagnosis Date  . Depression   . GERD (gastroesophageal reflux disease)   . Hyperlipidemia   . Hypertension     Patient Active Problem List   Diagnosis Date Noted  . Right ovarian cyst 02/07/2016  . Dog bite of ankle 12/10/2014  . Lumbar radiculopathy 10/22/2014  . Peripheral vertigo 07/13/2014  . Imbalance 06/09/2014  . Abnormal gait 06/09/2014  . Rupture of distal biceps tendon 02/20/2014  . Pain in thoracic spine 02/20/2014  . 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 ABDOMINAL HYSTERECTOMY    . BACK SURGERY      OB History    No data available       Home Medications    Prior to Admission medications   Medication Sig Start Date End Date Taking? Authorizing Provider  ALPRAZolam Prudy Feeler) 1 MG tablet Take 1 mg by mouth as needed for anxiety.   Yes [provider]  dicyclomine (BENTYL) 20 MG tablet Take 20 mg by mouth 4 (four) times daily. 01/10/17  Yes [provider]  hydrochlorothiazide (HYDRODIURIL) 25 MG tablet Take 25 mg by mouth daily.   Yes [provider]  methylPREDNISolone (MEDROL DOSEPAK) 4 MG TBPK tablet TAKE 6 TABLETS ON DAY 1 AS DIRECTED ON PACKAGE AND DECREASE BY 1 TAB EACH DAY FOR A TOTAL OF 6 DAYS 01/11/17  Yes [provider]  pantoprazole (PROTONIX) 40 MG tablet Take 40 mg by mouth 2 (two) times daily. 06/14/16  Yes [provider]  PARoxetine (PAXIL) 40 MG tablet Take 40 mg by mouth at bedtime.    Yes [provider]  PROAIR HFA 108 [id number] Base) MCG/ACT inhaler Take 1-2 puffs by mouth every 6 (six) hours as needed for shortness of breath. 12/07/16  Yes [provider]  traZODone (DESYREL) 100 MG tablet Take 100 mg by mouth at bedtime as needed for sleep. 12/08/16  Yes [provider]  gabapentin (NEURONTIN) 400 MG capsule TAKE 1 CAPSULE FOUR TIMES A DAY Patient not taking: Reported on  01/17/2017 12/23/15   Judi SaaSmith, Zachary M, DO  methocarbamol (ROBAXIN) 500 MG tablet TAKE 1 TABLET THREE TIMES A DAY Patient not taking: Reported on 01/17/2017 07/27/15   Judi SaaSmith, Zachary M, DO  PrednisoLONE Sodium Phosphate 25 MG/5ML SOLN Take 50 mg by mouth daily. 01/17/17 01/18/17  Alvira MondaySchlossman, Chavela Justiniano, MD    Family History Family History  Problem Relation Age of Onset  . Hypertension Mother     Social History Social History  Substance Use Topics  . Smoking status: Current Every Day Smoker    Packs/day: 0.50    Types: Cigarettes  . Smokeless  tobacco: Never Used  . Alcohol use Yes     Allergies   Contrast media [iodinated diagnostic agents]; Ioxaglate; Vicodin [hydrocodone-acetaminophen]; and Iohexol   Review of Systems Review of Systems  Constitutional: Negative for fever.  HENT: Negative for sore throat.   Eyes: Negative for visual disturbance.  Respiratory: Negative for cough and shortness of breath.   Cardiovascular: Negative for chest pain.  Gastrointestinal: Positive for nausea. Negative for abdominal pain, diarrhea and vomiting.  Genitourinary: Negative for difficulty urinating.  Musculoskeletal: Negative for back pain and neck pain.  Skin: Negative for rash.  Neurological: Positive for light-headedness. Negative for syncope and headaches.     Physical Exam Updated Vital Signs BP 134/79 (BP Location: Right Arm)   Pulse 78   Temp (!) 97.5 F (36.4 C) (Oral)   Resp 18   LMP 07/01/2011   SpO2 100%   Physical Exam  Constitutional: She is oriented to person, place, and time. She appears well-developed and well-nourished. No distress.  HENT:  Head: Normocephalic and atraumatic.  Eyes: Conjunctivae and EOM are normal.  Neck: Normal range of motion.  Cardiovascular: Normal rate, regular rhythm, normal heart sounds and intact distal pulses.  Exam reveals no gallop and no friction rub.   No murmur heard. Pulmonary/Chest: Effort normal and breath sounds normal. No respiratory distress. She has no wheezes. She has no rales.  Abdominal: Soft. She exhibits no distension. There is no tenderness. There is no guarding.  Musculoskeletal: She exhibits no edema or tenderness.  Neurological: She is alert and oriented to person, place, and time.  Skin: Skin is warm and dry. Rash noted. Rash is urticarial (diffuse). She is not diaphoretic. No erythema.  Nursing note and vitals reviewed.    ED Treatments / Results  Labs (all labs ordered are listed, but only abnormal results are displayed) Labs Reviewed - No data to  display  EKG  EKG Interpretation None       Radiology No results found.  Procedures Procedures (including critical care time)  Medications Ordered in ED Medications - No data to display   Initial Impression / Assessment and Plan / ED Course  I have reviewed the triage vital signs and the nursing notes.  Pertinent labs & imaging results that were available during my care of the patient were reviewed by me and considered in my medical decision making (see chart for details).     56 year old female with a history of hypertension, hyperlipidemia, depression presents with concern for allergic reaction. Received epi with EMS.  On arrival to the ED, rash improving, no other remaining symptoms of anaphylaxis.  Observed for 4 hours without rebound of symptoms and with resolution of rash. Given rx epi pen, discussed need to get rx on way home and use.  Pt on short steroid taper 6 days, told to take prednisone tomorrow as rx and then last day of methylprednisolone.  Discussed benadryl/ranitidine. Patient discharged in stable condition with understanding of reasons to return.   Final Clinical Impressions(s) / ED Diagnoses   Final diagnoses:  Anaphylaxis, initial encounter    New Prescriptions Discharge Medication List as of 01/17/2017 11:21 PM    START taking these medications   Details  EPINEPHrine 0.3 mg/0.3 mL IJ SOAJ injection Inject 0.3 mLs (0.3 mg total) into the muscle once., Starting Wed 01/17/2017, Print    PrednisoLONE Sodium Phosphate 25 MG/5ML SOLN Take 50 mg by mouth daily., Starting Wed 01/17/2017, Until Thu 01/18/2017, Print         Alvira MondaySchlossman, Monterio Bob, MD 01/18/17 [id number]

## 2017-01-17 NOTE — ED Triage Notes (Signed)
Patient BIB [facility] for allergic reaction that started about 5 minutes after chewing icebreakers raspberry sorbet chewing gum. Pt began to itch, feel flushed/hot, nausea, dizziness, and diffuse hives. . Pt went to urgent care and collapsed, but retained consciousness. EMS was called. Pt was given decodron by urgent care. 0.3 epi given by EMS, hives improved all areas but rt arm, another round 0.3 epi given 10 min later hives now improved  on rt arm, worse on left arm. Pt also given total of 8mg  odt zofran, 50 mg benadryl, 500ml ns. Pt reports improved but continued nausea.

## 2017-01-29 ENCOUNTER — Ambulatory Visit: Payer: Managed Care, Other (non HMO) | Admitting: Internal Medicine

## 2017-02-21 ENCOUNTER — Ambulatory Visit (INDEPENDENT_AMBULATORY_CARE_PROVIDER_SITE_OTHER): Payer: Managed Care, Other (non HMO) | Admitting: Allergy and Immunology

## 2017-02-21 ENCOUNTER — Encounter: Payer: Self-pay | Admitting: Allergy and Immunology

## 2017-02-21 VITALS — BP 128/80 | HR 76 | Temp 97.8°F | Resp 16 | Ht 63.8 in | Wt 143.8 lb

## 2017-02-21 DIAGNOSIS — T7840XA Allergy, unspecified, initial encounter: Secondary | ICD-10-CM | POA: Insufficient documentation

## 2017-02-21 DIAGNOSIS — J42 Unspecified chronic bronchitis: Secondary | ICD-10-CM | POA: Diagnosis not present

## 2017-02-21 DIAGNOSIS — L500 Allergic urticaria: Secondary | ICD-10-CM | POA: Insufficient documentation

## 2017-02-21 DIAGNOSIS — T7840XD Allergy, unspecified, subsequent encounter: Secondary | ICD-10-CM | POA: Diagnosis not present

## 2017-02-21 MED ORDER — LEVOCETIRIZINE DIHYDROCHLORIDE 5 MG PO TABS: 5.0000 mg | ORAL_TABLET | Freq: Every evening | ORAL | 5 refills | Status: DC

## 2017-02-21 MED ORDER — BUDESONIDE-FORMOTEROL FUMARATE 160-4.5 MCG/ACT IN AERO: 2.0000 | INHALATION_SPRAY | Freq: Two times a day (BID) | RESPIRATORY_TRACT | 5 refills | Status: DC

## 2017-02-21 NOTE — Progress Notes (Signed)
New Patient Note  RE: Amber Noble MRN: [id number] DOB: Sep 05, 1960 Date of Office Visit: 02/21/2017  Referring provider: No ref. provider found Primary care provider: Pc, [facility]  Chief Complaint: Allergic Reactions   History of present illness: Amber Noble is a 56 y.o. female presenting today for evaluation of allergic reactions.  She reports that on July 31 she chewed raspberry sorbet gum, a brand which she had never tried before, an approximate 20 minutes later began to experience the sensation that her years were "closing up", developed generalized urticaria and felt like her "whole body was on fire", nausea, and syncope.  She is taking by bystander to the local urgent care and then was transported via [facility].  During transport epinephrine was administered twice.  In the ER she was treated and observed for 4 hours prior to discharge in stable condition.  She was given a prescription for epinephrine autoinjector's.  Approximately 2 weeks after the initial incident, she was eating a baked potato with sour cream and cheese and began to experience similar symptoms to her initial reaction.  She self administered epinephrine without benefit and was taken to [facility] emergency department.  She had generalized urticaria, facial angioedema, tongue angioedema, and was syncopal.  She was treated in the emergency department with epinephrine, and his pains, steroids, and fluids.  She notes that in the past she has experienced abdominal discomfort with red Gatorade and therefore is curious if she may be allergic to red food dye.  The gum that she had chewed on July 31 was red, however she does not believe that she consumed red food dye prior to her second reaction. She is a current cigarette smoker and has a 40-pack-year history.  She has albuterol rescue for chest tightness, dyspnea, and wheezing.  She states that she experiences chest tightness  and/or wheezing on a daily basis.  She is currently not on a controller medication.   Assessment and plan: Allergic reaction The patient's history suggests allergic reaction with an unclear trigger. Food allergen skin tests were negative today despite a positive histamine control.  We will proceed with in vitro lab studies to clarify the etiology.  The following labs have been ordered: Baseline serum tryptase, serum specific IgE against red food dye, yellow food dye, food panel, white potato, and galactose-alpha-1,3-galactose. An additional lab order for serum tryptase has been provided which is to be kept by the patient to be drawn in the emergency department within 4 hours of symptom onset should symptoms recur.  A prescription has been provided for levocetirizine, 5mg  daily.  Should symptoms recur, the patient has been asked to keep a journal to record any foods eaten, beverages consumed, medications taken within a 6 hour period prior to the onset of symptoms, as well as record activities being performed, and environmental conditions. For any symptoms concerning for anaphylaxis, epinephrine is to be administered and 911 is to be called immediately.  Chronic bronchitis (HCC)  Tobacco cessation has been discussed and encouraged.  A prescription has been provided for Symbicort (budesonide/formoterol) 160/4.5 g,  2 inhalations twice a day. To maximize pulmonary deposition, a spacer has been provided along with instructions for its proper administration with an HFA inhaler.  Continue albuterol HFA, 1-2 inhalations every 4-6 hours as needed.  Subjective and objective measures of pulmonary function will be followed and the treatment plan will be adjusted accordingly.   Meds ordered this encounter  Medications  .  budesonide-formoterol (SYMBICORT) 160-4.5 MCG/ACT inhaler    Sig: Inhale 2 puffs into the lungs 2 (two) times daily.    Dispense:  1 Inhaler    Refill:  5  . levocetirizine (XYZAL)  5 MG tablet    Sig: Take 1 tablet (5 mg total) by mouth every evening.    Dispense:  30 tablet    Refill:  5    Diagnostics: Spirometry: Spirometry reveals an FVC of 2.54 L and an FEV1 of 2.16 L (81% predicted) without postbronchodilator reversibility.  Please see scanned spirometry results for details. Food allergen skin testing:  Negative despite a positive histamine control.    Physical examination: Blood pressure 128/80, pulse 76, temperature 97.8 F (36.6 C), temperature source Oral, resp. rate 16, height 5' 3.8" (1.621 m), weight 143 lb 12.8 oz (65.2 kg), last menstrual period 07/01/2011, SpO2 96 %.  General: Alert, interactive, in no acute distress. Neck: Supple without lymphadenopathy. Lungs: Mildly decreased breath sounds bilaterally without wheezing, rhonchi or rales. CV: Normal S1, S2 without murmurs. Abdomen: Nondistended, nontender. Skin: Warm and dry, without lesions or rashes. Extremities:  No clubbing, cyanosis or edema. Neuro:   Grossly intact.  Review of systems:  Review of systems negative except as noted in HPI / PMHx or noted below: Review of Systems  Constitutional: Negative.   HENT: Negative.   Eyes: Negative.   Respiratory: Negative.   Cardiovascular: Negative.   Gastrointestinal: Negative.   Genitourinary: Negative.   Musculoskeletal: Negative.   Skin: Negative.   Neurological: Negative.   Endo/Heme/Allergies: Negative.   Psychiatric/Behavioral: Negative.     Past medical history:  Past Medical History:  Diagnosis Date  . Bronchiolitis   . Depression   . GERD (gastroesophageal reflux disease)   . Hyperlipidemia   . Hypertension     Past surgical history:  Past Surgical History:  Procedure Laterality Date  . ABDOMINAL HYSTERECTOMY    . BACK SURGERY      Family history: Family History  Problem Relation Age of Onset  . Hypertension Mother   . Allergic rhinitis Neg Hx   . Angioedema Neg Hx   . Asthma Neg Hx   . Eczema Neg Hx   .  Immunodeficiency Neg Hx   . Urticaria Neg Hx     Social history: Social History   Social History  . Marital status: Married    Spouse name: N/A  . Number of children: N/A  . Years of education: N/A   Occupational History  . Not on file.   Social History Main Topics  . Smoking status: Current Every Day Smoker    Packs/day: 1.00    Types: Cigarettes  . Smokeless tobacco: Never Used  . Alcohol use Yes  . Drug use: No  . Sexual activity: Not on file   Other Topics Concern  . Not on file   Social History Narrative  . No narrative on file   Environmental History: The patient lives in a 56 year old house with carpeting throughout and central air/heat.  There is a dog in the home which does not have access to her bedroom.  There is no known mold/water damage in the home.  She has smoked one pack of cigarettes per day on average since she was 56 years old.  Allergies as of 02/21/2017      Reactions   Contrast Media [iodinated Diagnostic Agents] Hives, Swelling, Other (See Comments)   Other reaction(s): HIVES, ITCHING Pt was pre-medicated prior to last imaging and still had  HIVES/ITCHING afterward Throat  13 hour prep   Ioxaglate Swelling, Hives   Throat  13 hour prep   Vicodin [hydrocodone-acetaminophen] Itching   Iohexol Rash   Desc: PREMED BENADRYL Desc: PREMED BENADRYL      Medication List       Accurate as of 02/21/17  1:19 PM. Always use your most recent med list.          ALPRAZolam 1 MG tablet Commonly known as:  XANAX Take 1 mg by mouth as needed for anxiety.   budesonide-formoterol 160-4.5 MCG/ACT inhaler Commonly known as:  SYMBICORT Inhale 2 puffs into the lungs 2 (two) times daily.   dicyclomine 20 MG tablet Commonly known as:  BENTYL Take 20 mg by mouth 4 (four) times daily.   EPINEPHrine 0.3 mg/0.3 mL Soaj injection Commonly known as:  EPI-PEN Inject 0.3 mg into the muscle once.   gabapentin 400 MG capsule Commonly known as:  NEURONTIN TAKE 1  CAPSULE FOUR TIMES A DAY   hydrochlorothiazide 25 MG tablet Commonly known as:  HYDRODIURIL Take 25 mg by mouth daily.   levocetirizine 5 MG tablet Commonly known as:  XYZAL Take 1 tablet (5 mg total) by mouth every evening.   meclizine 25 MG tablet Commonly known as:  ANTIVERT Take 25 mg by mouth 3 (three) times daily as needed for dizziness.   methocarbamol 500 MG tablet Commonly known as:  ROBAXIN TAKE 1 TABLET THREE TIMES A DAY   methylPREDNISolone 4 MG Tbpk tablet Commonly known as:  MEDROL DOSEPAK TAKE 6 TABLETS ON DAY 1 AS DIRECTED ON PACKAGE AND DECREASE BY 1 TAB EACH DAY FOR A TOTAL OF 6 DAYS   ondansetron 4 MG tablet Commonly known as:  ZOFRAN Take 4 mg by mouth every 8 (eight) hours as needed for nausea or vomiting.   pantoprazole 40 MG tablet Commonly known as:  PROTONIX Take 40 mg by mouth 2 (two) times daily.   PARoxetine 40 MG tablet Commonly known as:  PAXIL Take 40 mg by mouth at bedtime.   PROAIR HFA 108 (90 Base) MCG/ACT inhaler Generic drug:  albuterol Take 1-2 puffs by mouth every 6 (six) hours as needed for shortness of breath.   traZODone 100 MG tablet Commonly known as:  DESYREL Take 100 mg by mouth at bedtime as needed for sleep.            Discharge Care Instructions        Start     Ordered   02/21/17 0000  Spirometry with Graph    Question Answer Comment  Where should this test be performed? Other   Basic spirometry No   Spirometry pre & post bronchodilator Yes      02/21/17 1043   02/21/17 0000  Allergy Test    Question:  Allergy test to perform  Answer:  adult foods   02/21/17 1043   02/21/17 0000  Tryptase     02/21/17 1043   02/21/17 0000  Allergen, Red (Carmine) Dye, [id number]     02/21/17 1043   02/21/17 0000  Allergen, White Potato,[id number]     02/21/17 1043   02/21/17 0000  Alpha-Gal Panel     02/21/17 1043   02/21/17 0000  budesonide-formoterol (SYMBICORT) 160-4.5 MCG/ACT inhaler  2 times daily     02/21/17 1043    02/21/17 0000  levocetirizine (XYZAL) 5 MG tablet  Every evening     02/21/17 1043      Known medication allergies: Allergies  Allergen  Reactions  . Contrast Media [Iodinated Diagnostic Agents] Hives, Swelling and Other (See Comments)    Other reaction(s): HIVES, ITCHING Pt was pre-medicated prior to last imaging and still had HIVES/ITCHING afterward Throat  13 hour prep  . Ioxaglate Swelling and Hives    Throat  13 hour prep  . Vicodin [Hydrocodone-Acetaminophen] Itching  . Iohexol Rash    Desc: PREMED BENADRYL Desc: PREMED BENADRYL     I appreciate the opportunity to take part in Amber Noble's care. Please do not hesitate to contact me with questions.  Sincerely,   R. Jorene Guest, MD

## 2017-02-21 NOTE — Patient Instructions (Addendum)
Allergic reaction The patient's history suggests allergic reaction with an unclear trigger. Food allergen skin tests were negative today despite a positive histamine control.  We will proceed with in vitro lab studies to clarify the etiology.  The following labs have been ordered: Baseline serum tryptase, serum specific IgE against red food dye, yellow food dye, food panel, white potato, and galactose-alpha-1,3-galactose. An additional lab order for serum tryptase has been provided which is to be kept by the patient to be drawn in the emergency department within 4 hours of symptom onset should symptoms recur.  A prescription has been provided for levocetirizine, 5mg  daily.  Should symptoms recur, the patient has been asked to keep a journal to record any foods eaten, beverages consumed, medications taken within a 6 hour period prior to the onset of symptoms, as well as record activities being performed, and environmental conditions. For any symptoms concerning for anaphylaxis, epinephrine is to be administered and 911 is to be called immediately.  Chronic bronchitis (HCC)  Tobacco cessation has been discussed and encouraged.  A prescription has been provided for Symbicort (budesonide/formoterol) 160/4.5 g,  2 inhalations twice a day. To maximize pulmonary deposition, a spacer has been provided along with instructions for its proper administration with an HFA inhaler.  Continue albuterol HFA, 1-2 inhalations every 4-6 hours as needed.  Subjective and objective measures of pulmonary function will be followed and the treatment plan will be adjusted accordingly.   When lab results have returned the patient will be called with further recommendations and follow up instructions.

## 2017-02-21 NOTE — Assessment & Plan Note (Addendum)
   Tobacco cessation has been discussed and encouraged.  A prescription has been provided for Symbicort (budesonide/formoterol) 160/4.5 g, 2 inhalations twice a day. To maximize pulmonary deposition, a spacer has been provided along with instructions for its proper administration with an HFA inhaler.  Continue albuterol HFA, 1-2 inhalations every 4-6 hours as needed.  Subjective and objective measures of pulmonary function will be followed and the treatment plan will be adjusted accordingly.

## 2017-02-21 NOTE — Assessment & Plan Note (Addendum)
The patient's history suggests allergic reaction with an unclear trigger. Food allergen skin tests were negative today despite a positive histamine control.  We will proceed with in vitro lab studies to clarify the etiology.  The following labs have been ordered: Baseline serum tryptase, serum specific IgE against red food dye, yellow food dye, food panel, white potato, and galactose-alpha-1,3-galactose. An additional lab order for serum tryptase has been provided which is to be kept by the patient to be drawn in the emergency department within 4 hours of symptom onset should symptoms recur.  A prescription has been provided for levocetirizine, 5mg  daily.  Should symptoms recur, the patient has been asked to keep a journal to record any foods eaten, beverages consumed, medications taken within a 6 hour period prior to the onset of symptoms, as well as record activities being performed, and environmental conditions. For any symptoms concerning for anaphylaxis, epinephrine is to be administered and 911 is to be called immediately.

## 2017-03-01 ENCOUNTER — Institutional Professional Consult (permissible substitution): Payer: Managed Care, Other (non HMO) | Admitting: Internal Medicine

## 2017-03-01 LAB — ALPHA-GAL PANEL
Alpha Gal IgE*: <0.1 kU/L (ref <0.35)
Beef (Bos spp) IgE: <0.1 kU/L (ref <0.35)
Class Interpretation: 0
Class Interpretation: 0
Class Interpretation: 0
Lamb/Mutton (Ovis spp) IgE: <0.1 kU/L (ref <0.35)
Pork (Sus spp) IgE: <0.1 kU/L (ref <0.35)

## 2017-03-01 LAB — ALLERGEN, RED (CARMINE) DYE, RF340: F340-IgE Carmine Red Dye: <0.1 kU/L

## 2017-03-01 LAB — TRYPTASE: Tryptase: 1.5 ug/L — ABNORMAL LOW (ref 2.2–13.2)

## 2017-03-01 LAB — ALLERGEN, WHITE POTATO,F35: Allergen Potato, White IgE: 0.49 kU/L — AB

## 2017-09-28 ENCOUNTER — Encounter: Payer: Self-pay | Admitting: Gastroenterology

## 2017-10-25 ENCOUNTER — Encounter: Payer: Self-pay | Admitting: Gastroenterology

## 2017-10-26 ENCOUNTER — Other Ambulatory Visit (INDEPENDENT_AMBULATORY_CARE_PROVIDER_SITE_OTHER): Payer: Managed Care, Other (non HMO)

## 2017-10-26 ENCOUNTER — Encounter: Payer: Self-pay | Admitting: Gastroenterology

## 2017-10-26 ENCOUNTER — Ambulatory Visit (INDEPENDENT_AMBULATORY_CARE_PROVIDER_SITE_OTHER): Payer: Managed Care, Other (non HMO) | Admitting: Gastroenterology

## 2017-10-26 VITALS — BP 120/78 | HR 71 | Wt 143.5 lb

## 2017-10-26 DIAGNOSIS — R634 Abnormal weight loss: Secondary | ICD-10-CM

## 2017-10-26 DIAGNOSIS — R1310 Dysphagia, unspecified: Secondary | ICD-10-CM

## 2017-10-26 DIAGNOSIS — R1013 Epigastric pain: Secondary | ICD-10-CM

## 2017-10-26 LAB — COMPREHENSIVE METABOLIC PANEL
ALBUMIN: 3.8 g/dL (ref 3.5–5.2)
ALK PHOS: 62 U/L (ref 39–117)
ALT: 12 U/L (ref 0–35)
AST: 14 U/L (ref 0–37)
BUN: 7 mg/dL (ref 6–23)
CHLORIDE: 94 meq/L — AB (ref 96–112)
CO2: 29 mEq/L (ref 19–32)
Calcium: 9.1 mg/dL (ref 8.4–10.5)
Creatinine, Ser: 0.82 mg/dL (ref 0.40–1.20)
GFR: 76.45 mL/min (ref >60.00)
GLUCOSE: 92 mg/dL (ref 70–99)
POTASSIUM: 4 meq/L (ref 3.5–5.1)
SODIUM: 130 meq/L — AB (ref 135–145)
Total Bilirubin: 0.2 mg/dL (ref 0.2–1.2)
Total Protein: 6.6 g/dL (ref 6.0–8.3)

## 2017-10-26 LAB — CBC WITH DIFFERENTIAL/PLATELET
BASOS PCT: 0.7 % (ref 0.0–3.0)
Basophils Absolute: 0 10*3/uL (ref 0.0–0.1)
EOS PCT: 0.9 % (ref 0.0–5.0)
Eosinophils Absolute: 0 10*3/uL (ref 0.0–0.7)
HCT: 36.1 % (ref 36.0–46.0)
HEMOGLOBIN: 11.9 g/dL — AB (ref 12.0–15.0)
Lymphocytes Relative: 23.2 % (ref 12.0–46.0)
Lymphs Abs: 1.3 10*3/uL (ref 0.7–4.0)
MCHC: 33.1 g/dL (ref 30.0–36.0)
MCV: 81.8 fl (ref 78.0–100.0)
MONO ABS: 0.6 10*3/uL (ref 0.1–1.0)
MONOS PCT: 10.7 % (ref 3.0–12.0)
Neutro Abs: 3.5 10*3/uL (ref 1.4–7.7)
Neutrophils Relative %: 64.5 % (ref 43.0–77.0)
Platelets: 341 10*3/uL (ref 150.0–400.0)
RBC: 4.41 Mil/uL (ref 3.87–5.11)
RDW: 19.7 % — AB (ref 11.5–15.5)
WBC: 5.4 10*3/uL (ref 4.0–10.5)

## 2017-10-26 LAB — LIPASE: LIPASE: 62 U/L — AB (ref 11.0–59.0)

## 2017-10-26 LAB — TSH: TSH: 2.08 u[IU]/mL (ref 0.35–4.50)

## 2017-10-26 NOTE — Patient Instructions (Addendum)
If you are age 57 or older, your body mass index should be between 23-30. Your Body mass index is 24.79 kg/m. If this is out of the aforementioned range listed, please consider follow up with your Primary Care Provider.  If you are age 43 or younger, your body mass index should be between 19-25. Your Body mass index is 24.79 kg/m. If this is out of the aformentioned range listed, please consider follow up with your Primary Care Provider.   Please stop at the lab before you leave the office today.   Check your weight every day.   You have been scheduled for a Barium Esophogram at [facility] (1st floor of the hospital) on 11/06/17 at 9:30am. Please arrive 15 minutes prior to your appointment for registration. Make certain not to have anything to eat or drink 6 hours prior to your test. If you need to reschedule for any reason, please contact radiology at [phone] to do so. __________________________________________________________________ A barium swallow is an examination that concentrates on views of the esophagus. This tends to be a double contrast exam (barium and two liquids which, when combined, create a gas to distend the wall of the oesophagus) or single contrast (non-ionic iodine based). The study is usually tailored to your symptoms so a good history is essential. Attention is paid during the study to the form, structure and configuration of the esophagus, looking for functional disorders (such as aspiration, dysphagia, achalasia, motility and reflux) EXAMINATION You may be asked to change into a gown, depending on the type of swallow being performed. A radiologist and radiographer will perform the procedure. The radiologist will advise you of the type of contrast selected for your procedure and direct you during the exam. You will be asked to stand, sit or lie in several different positions and to hold a small amount of fluid in your mouth before being asked to swallow while the  imaging is performed .In some instances you may be asked to swallow barium coated marshmallows to assess the motility of a solid food bolus. The exam can be recorded as a digital or video fluoroscopy procedure. POST PROCEDURE It will take 1-2 days for the barium to pass through your system. To facilitate this, it is important, unless otherwise directed, to increase your fluids for the next 24-48hrs and to resume your normal diet.  This test typically takes about 30 minutes to perform. ______________________________________________________________________________  Thank you,  Dr. Lynann Bologna

## 2017-10-26 NOTE — Progress Notes (Signed)
Chief Complaint: Abdominal pain dysphagia,   Referring Provider:  Pc, [facility]      ASSESSMENT AND PLAN;   #1. 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 Esophageal dysphagia with weight loss. #3. Chronic constipation, diarrhea after dulcolax. #4. Wt loss (20lb over last 2 months) #4. H/O recurrent pancreatitis - ? Dx- adm to [facility] 04/2016, neg WU.  PLAN: - Continue Omepazole  po bid. - Ba swallow with barium tablet to delineate esophageal anatomy. - EGD with dil. - Stop all goodys/nonsteroidals. - Check CBC, CMP, lipase, celiac and TSH. - Check UA and tox screen - Stop smoking. - Check weight every week and bring recordings to the next visit.  Patient is to report only if there is continued weight loss. If continued weight loss, will proceed with CT scan of the abdomen and pelvis with p.o. and IV contrast. She will require contrast desensitization. I have discussed above in detail with the patient.  All the contact numbers were given.  Patient knows how to get in touch with [place]. - FU in 12 weeks.     HPI:   57 year old With a dysphagia to both solids and liquids over the last 3 to 4 months which is getting worse.  The food gets hung up in the mid chest.  She has been on baby food and pured diet over the last 2 weeks.  Has lost over 20 pounds.  She continues to smoke despite medical advice.  There is no melena or hematochezia.  She also has chronic abdominal pain mainly in the epigastric area, -sharp, exacerbated after eating.  Has history of ulcers, continues to use Goody powders.  She denies having any fever chills or night sweats.  She does not have any significant diarrhea.  She has history of constipation, took Dulcolax and then started having diarrhea which is resolved. No associated nausea/vomiting, significant heartburn.  Her last colonoscopy in [facility] 2014.  Per patient, she was told that she does not  need another colonoscopy for 10 years.  She was admitted to [facility] in November 2017 with presumptive diagnosis of acute pancreatitis.  Patient underwent CT scan of the abdomen and pelvis without contrast (since she was allergic to IV contrast) which showed focal panniculitis.  No pancreatitis.  Liver function tests were normal.  Her gallbladder was normal.  She does have history of alcohol use but none recently.   Past Medical History:  Diagnosis Date  . Abdominal pain, acute, right upper quadrant   . Acute anxiety   . Acute epigastric pain   . Bronchiolitis   . Depression   . Fatigue   . GERD (gastroesophageal reflux disease)   . Hypercholesteremia   . Hyperhidrosis   . Hyperlipidemia   . Hypertension   . IBS (irritable bowel syndrome)   . Insomnia   . Menopause   . Pancreatitis   . Weight loss, unintentional     Past Surgical History:  Procedure Laterality Date  . BACK SURGERY  2012   disc removal  . BUNIONECTOMY Left 11/2016  . COLONOSCOPY  2014   Negative, no polyps. Rpt 2024  . LUMBAR FUSION  12/2011  . PARTIAL HYSTERECTOMY  2004  . PARTIAL HYSTERECTOMY  2003  . UPPER GI ENDOSCOPY  08/21/2014   Gastric Ulcer, MOderate gastritis. BX Reactive Gastropathy    Family History  Problem Relation Age of Onset  . Hypertension Mother   .  Hypercholesterolemia Mother   . Prostate cancer Father   . Melanoma Sister   . Diabetes Sister   . Heart failure Maternal Grandmother   . CVA Maternal Grandmother   . Allergic rhinitis Neg Hx   . Angioedema Neg Hx   . Asthma Neg Hx   . Eczema Neg Hx   . Immunodeficiency Neg Hx   . Urticaria Neg Hx     Social History   Tobacco Use  . Smoking status: Current Every Day Smoker    Packs/day: 1.00    Types: Cigarettes  . Smokeless tobacco: Never Used  Substance Use Topics  . Alcohol use: Yes  . Drug use: No    Current Outpatient Medications  Medication Sig Dispense Refill  . clonazePAM (KLONOPIN) 1 MG tablet Take 1 mg by mouth  2 (two) times daily.    Marland Kitchen dicyclomine (BENTYL) 20 MG tablet Take 20 mg by mouth 4 (four) times daily.  5  . EPINEPHrine 0.3 mg/0.3 mL IJ SOAJ injection Inject 0.3 mg into the muscle once.    . hydrochlorothiazide (HYDRODIURIL) 25 MG tablet Take 25 mg by mouth daily.    Marland Kitchen levocetirizine (XYZAL) 5 MG tablet Take 1 tablet (5 mg total) by mouth every evening. 30 tablet 5  . losartan (COZAAR) 25 MG tablet Take 25 mg by mouth daily.    . meclizine (ANTIVERT) 25 MG tablet Take 25 mg by mouth 3 (three) times daily as needed for dizziness.    Marland Kitchen omeprazole (PRILOSEC) 20 MG capsule Take 20 mg by mouth 2 (two) times daily.    . ondansetron (ZOFRAN) 4 MG tablet Take 4 mg by mouth every 8 (eight) hours as needed for nausea or vomiting.    Marland Kitchen PARoxetine (PAXIL) 40 MG tablet Take 40 mg by mouth at bedtime.     Marland Kitchen PROAIR HFA 108 (90 Base) MCG/ACT inhaler Take 1-2 puffs by mouth every 6 (six) hours as needed for shortness of breath.  1  . traZODone (DESYREL) 100 MG tablet Take 100 mg by mouth at bedtime as needed for sleep.  1   No current facility-administered medications for this visit.     Allergies  Allergen Reactions  . Contrast Media [Iodinated Diagnostic Agents] Hives, Swelling and Other (See Comments)    Other reaction(s): HIVES, ITCHING Pt was pre-medicated prior to last imaging and still had HIVES/ITCHING afterward Throat  13 hour prep  . Ioxaglate Swelling and Hives    Throat  13 hour prep  . Vicodin [Hydrocodone-Acetaminophen] Itching  . Iohexol Rash    Desc: PREMED BENADRYL Desc: PREMED BENADRYL     Review of Systems:  Constitutional: Denies fever, chills, diaphoresis, has appetite change and Has  fatigue.  HEENT: Denies photophobia, eye pain, redness, hearing loss, ear pain, congestion, sore throat, rhinorrhea, sneezing, mouth sores, neck pain, neck stiffness and tinnitus.   Respiratory: SOB and cough. Cardiovascular: Denies chest pain, palpitations and leg swelling.  Genitourinary:  Denies dysuria, urgency, frequency, hematuria, flank pain and difficulty urinating.  Musculoskeletal: Denies myalgias, joint swelling, arthralgias and gait problem Has back pain Skin: No rash.  Neurological: Denies dizziness, seizures, syncope, weakness, light-headedness, numbness and headaches.  Hematological: Denies adenopathy. Easy bruising, personal or family bleeding history  Psychiatric/Behavioral: Has  anxiety or depression     Physical Exam:    BP 120/78   Pulse 71   Wt 143 lb 8 oz (65.1 kg)   LMP 07/01/2011   BMI 24.79 kg/m  [employer]   10/26/17  1610  Weight: 143 lb 8 oz (65.1 kg)   Constitutional:  Well-developed, in no acute distress. Psychiatric: Normal mood and affect. Behavior is normal. HEENT: Pupils normal.  Conjunctivae are normal. No scleral icterus. Neck supple.  Cardiovascular: Normal rate, regular rhythm. No edema Pulmonary/chest: Effort normal and bilateral decreased breath sounds. No wheezing, rales or rhonchi. Abdominal: Soft, nondistended.  Mildly tender in epigastric area , no rebound. Bowel sounds active throughout. There are no masses palpable. No hepatomegaly. Rectal:  defered Neurological: Alert and oriented to person place and time. Skin: Skin is warm and dry. No rashes noted.   Edman Circle, MD   Cc: [facility]

## 2017-10-27 LAB — DRUG SCREEN, URINE
Amphetamines, Urine: NEGATIVE ng/mL
BARBITURATE SCREEN URINE: NEGATIVE ng/mL
BENZODIAZEPINE QUANT UR: NEGATIVE ng/mL
Cannabinoid Quant, Ur: NEGATIVE ng/mL
Cocaine (Metab.): NEGATIVE ng/mL
Opiate Quant, Ur: NEGATIVE ng/mL
PCP Quant, Ur: NEGATIVE ng/mL

## 2017-10-29 ENCOUNTER — Telehealth: Payer: Self-pay

## 2017-10-29 LAB — CELIAC DISEASE COMPREHENSIVE PANEL WITH REFLEXES
(TTG) AB, IGA: 1 U/mL
IMMUNOGLOBULIN A: 199 mg/dL (ref 81–463)

## 2017-10-29 NOTE — Telephone Encounter (Signed)
mtcb

## 2017-10-30 ENCOUNTER — Telehealth: Payer: Self-pay

## 2017-10-30 NOTE — Telephone Encounter (Signed)
lmtcb for test results 

## 2017-10-31 ENCOUNTER — Telehealth: Payer: Self-pay

## 2017-10-31 NOTE — Telephone Encounter (Signed)
I have not been able to reach her by phone and she has not returned my calls so I sent her a letter with a copy of recent lab work.

## 2017-11-01 ENCOUNTER — Telehealth: Payer: Self-pay

## 2017-11-01 NOTE — Telephone Encounter (Signed)
She returned my call and I gave her the lab results.  Reminded her to keep appointments for Ba swallow and EGD.

## 2017-11-06 ENCOUNTER — Ambulatory Visit (HOSPITAL_COMMUNITY)
Admission: RE | Admit: 2017-11-06 | Discharge: 2017-11-06 | Disposition: A | Discharge status: Home / Self Care | Payer: Managed Care, Other (non HMO) | Source: Ambulatory Visit | Attending: Gastroenterology | Admitting: Gastroenterology

## 2017-11-06 DIAGNOSIS — R634 Abnormal weight loss: Secondary | ICD-10-CM | POA: Diagnosis present

## 2017-11-06 DIAGNOSIS — R1310 Dysphagia, unspecified: Secondary | ICD-10-CM | POA: Diagnosis present

## 2017-11-06 DIAGNOSIS — R1013 Epigastric pain: Secondary | ICD-10-CM | POA: Diagnosis not present

## 2017-11-13 ENCOUNTER — Telehealth: Payer: Self-pay

## 2017-11-13 NOTE — Telephone Encounter (Signed)
I left her a voiemail message with the results.

## 2017-11-15 ENCOUNTER — Ambulatory Visit (AMBULATORY_SURGERY_CENTER): Payer: Managed Care, Other (non HMO) | Admitting: Gastroenterology

## 2017-11-15 ENCOUNTER — Other Ambulatory Visit: Payer: Self-pay

## 2017-11-15 ENCOUNTER — Encounter: Payer: Self-pay | Admitting: Gastroenterology

## 2017-11-15 VITALS — BP 96/55 | HR 72 | Temp 98.7°F | Resp 15 | Ht 63.0 in | Wt 143.0 lb

## 2017-11-15 DIAGNOSIS — R1013 Epigastric pain: Secondary | ICD-10-CM

## 2017-11-15 DIAGNOSIS — R1319 Other dysphagia: Secondary | ICD-10-CM

## 2017-11-15 HISTORY — PX: UPPER GASTROINTESTINAL ENDOSCOPY: SHX188

## 2017-11-15 MED ORDER — SODIUM CHLORIDE 0.9 % IV SOLN: 500.0000 mL | Freq: Once | INTRAVENOUS | Status: AC

## 2017-11-15 NOTE — Progress Notes (Signed)
Called to room to assist during endoscopic procedure.  Patient ID and intended procedure confirmed with present staff. Received instructions for my participation in the procedure from the performing physician.  

## 2017-11-15 NOTE — Progress Notes (Signed)
Report to PACU, RN, vss, BBS= Clear.  

## 2017-11-15 NOTE — Op Note (Signed)
[facility] Patient Name: Amber Noble Procedure Date: 11/15/2017 2:13 PM MRN: [id number] Endoscopist: Jackquline Denmark , MD Age: 57 Referring MD:  Date of Birth: February 02, 1961 Gender: Female Account #: [id number] Procedure:                Upper GI endoscopy Indications:              #1. 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 Esophageal dysphagia with weight loss with                            negative Ba Swallow. Medicines:                Monitored Anesthesia Care Procedure:                Pre-Anesthesia Assessment:                           - Prior to the procedure, a History and Physical                            was performed, and patient medications and                            allergies were reviewed. The patient is competent.                            The risks and benefits of the procedure and the                            sedation options and risks were discussed with the                            patient. All questions were answered and informed                            consent was obtained. Patient identification and                            proposed procedure were verified by the physician                            in the procedure room. Mental Status Examination:                            alert and oriented. Prophylactic Antibiotics: The                            patient does not require prophylactic antibiotics.  Prior Anticoagulants: The patient has taken no                            previous anticoagulant or antiplatelet agents. ASA                            Grade Assessment: II - A patient with mild systemic                            disease. After reviewing the risks and benefits,                            the patient was deemed in satisfactory condition to                            undergo the procedure. The  anesthesia plan was to                            use monitored anesthesia care (MAC). Immediately                            prior to administration of medications, the patient                            was re-assessed for adequacy to receive sedatives.                            The heart rate, respiratory rate, oxygen                            saturations, blood pressure, adequacy of pulmonary                            ventilation, and response to care were monitored                            throughout the procedure. The physical status of                            the patient was re-assessed after the procedure.                           After obtaining informed consent, the endoscope was                            passed under direct vision. Throughout the                            procedure, the patient's blood pressure, pulse, and                            oxygen saturations were monitored continuously. The  Model [id number]-[id number]) scope was introduced                            through the mouth, and advanced to the second part                            of duodenum. The upper GI endoscopy was                            accomplished without difficulty. The patient                            tolerated the procedure well. Scope In: Scope Out: Findings:                 The distal esophagus was mildly tortuous. Biopsies                            were taken with a cold forceps for histology. The                            scope was withdrawn. Dilation was performed with a                            Maloney dilator with no resistance at 50 Fr.                           The exam was otherwise without abnormality. No                            ulcers. Complications:            No immediate complications. Estimated Blood Loss:     Estimated blood loss: none. Impression:               - Mildly torturous esophagus s/p empric esophageal                             dilatation.                           - The examination was otherwise normal. Recommendation:           - Patient has a contact number available for                            emergencies. The signs and symptoms of potential                            delayed complications were discussed with the                            patient. Return to normal activities tomorrow.                            Written discharge instructions were provided to the  patient.                           - Soft diet today.                           - Continue present medications. Continue omeprazolw                            28m po bid x 12 weeks, then reduce it to QD.                           - Stop smoking.                           - Await pathology results.                           - Return to GI clinic in 12 weeks. RJackquline Denmark MD 11/15/2017 2:33:40 PM This report has been signed electronically.

## 2017-11-15 NOTE — Patient Instructions (Signed)
*  Handout given on dilation diet. Soft diet today. Stop smoking. Call back in one month and make follow up appt for 12 weeks from TODAY.  YOU HAD AN ENDOSCOPIC PROCEDURE TODAY AT THE [facility]:   Refer to the procedure report that was given to you for any specific questions about what was found during the examination.  If the procedure report does not answer your questions, please call your gastroenterologist to clarify.  If you requested that your care partner not be given the details of your procedure findings, then the procedure report has been included in a sealed envelope for you to review at your convenience later.  YOU SHOULD EXPECT: Some feelings of bloating in the abdomen. Passage of more gas than usual.  Walking can help get rid of the air that was put into your GI tract during the procedure and reduce the bloating. If you had a lower endoscopy (such as a colonoscopy or flexible sigmoidoscopy) you may notice spotting of blood in your stool or on the toilet paper. If you underwent a bowel prep for your procedure, you may not have a normal bowel movement for a few days.  Please Note:  You might notice some irritation and congestion in your nose or some drainage.  This is from the oxygen used during your procedure.  There is no need for concern and it should clear up in a day or so.  SYMPTOMS TO REPORT IMMEDIATELY:    Following upper endoscopy (EGD)  Vomiting of blood or coffee ground material  New chest pain or pain under the shoulder blades  Painful or persistently difficult swallowing  New shortness of breath  Fever of 100F or higher  Black, tarry-looking stools  For urgent or emergent issues, a gastroenterologist can be reached at any hour by calling [phone].   DIET:  We do recommend a small meal at first, but then you may proceed to your regular diet.  Drink plenty of fluids but you should avoid alcoholic beverages for 24 hours.  ACTIVITY:  You should  plan to take it easy for the rest of today and you should NOT DRIVE or use heavy machinery until tomorrow (because of the sedation medicines used during the test).    FOLLOW UP: Our staff will call the number listed on your records the next business day following your procedure to check on you and address any questions or concerns that you may have regarding the information given to you following your procedure. If we do not reach you, we will leave a message.  However, if you are feeling well and you are not experiencing any problems, there is no need to return our call.  We will assume that you have returned to your regular daily activities without incident.  If any biopsies were taken you will be contacted by phone or by letter within the next 1-3 weeks.  Please call us at [phone] if you have not heard about the biopsies in 3 weeks.    SIGNATURES/CONFIDENTIALITY: You and/or your care partner have signed paperwork which will be entered into your electronic medical record.  These signatures attest to the fact that that the information above on your After Visit Summary has been reviewed and is understood.  Full responsibility of the confidentiality of this discharge information lies with you and/or your care-partner.

## 2017-11-16 ENCOUNTER — Telehealth: Payer: Self-pay

## 2017-11-16 ENCOUNTER — Telehealth: Payer: Self-pay | Admitting: *Deleted

## 2017-11-16 NOTE — Telephone Encounter (Signed)
  Follow up Call-  Call back number 11/15/2017  Post procedure Call Back phone  # [phone]  Permission to leave phone message Yes  Some recent data might be hidden     Left message

## 2017-11-16 NOTE — Telephone Encounter (Signed)
No answer, second call.  Left message to call if questions or concerns. 

## 2017-11-22 ENCOUNTER — Encounter: Payer: Self-pay | Admitting: Gastroenterology

## 2017-11-23 ENCOUNTER — Telehealth: Payer: Self-pay

## 2017-11-23 NOTE — Telephone Encounter (Signed)
I mailed her the result letter and included a brochure on EoE as it was mentioned in the letter.

## 2017-11-28 ENCOUNTER — Telehealth: Payer: Self-pay | Admitting: Gastroenterology

## 2017-11-28 NOTE — Telephone Encounter (Signed)
Called to report that she is experiencing alternating diarrhea with constipation, still has epigastric pain right>left.  Has tried benefiber and OTC anti-diarrheals but has not seen any improvement.  Please advise.  Thank you.

## 2017-11-28 NOTE — Telephone Encounter (Signed)
Lets try bentyl 10mg  po bid If she has lost any more weight, she needs CT (after contrast desenstization). Has she cut down on smoking?

## 2017-11-29 MED ORDER — DICYCLOMINE HCL 10 MG PO CAPS: 10.0000 mg | ORAL_CAPSULE | Freq: Two times a day (BID) | ORAL | 0 refills | Status: AC

## 2017-11-29 NOTE — Telephone Encounter (Signed)
Patient notified. Medication to [facility] in [place]. Down to smoking a 1/2 pack daily.

## 2017-12-13 ENCOUNTER — Encounter: Payer: Self-pay | Admitting: Gastroenterology

## 2017-12-14 ENCOUNTER — Other Ambulatory Visit (HOSPITAL_COMMUNITY)
Admission: RE | Admit: 2017-12-14 | Discharge: 2017-12-14 | Disposition: A | Discharge status: Home / Self Care | Payer: Managed Care, Other (non HMO) | Source: Other Acute Inpatient Hospital | Attending: Nurse Practitioner | Admitting: Nurse Practitioner

## 2017-12-14 DIAGNOSIS — R112 Nausea with vomiting, unspecified: Secondary | ICD-10-CM | POA: Insufficient documentation

## 2017-12-14 DIAGNOSIS — R197 Diarrhea, unspecified: Secondary | ICD-10-CM | POA: Diagnosis not present

## 2017-12-15 LAB — LACTOFERRIN, FECAL, QUALITATIVE: Lactoferrin, Fecal, Qual: NEGATIVE

## 2017-12-18 ENCOUNTER — Encounter: Payer: Self-pay | Admitting: Gastroenterology

## 2017-12-20 LAB — STOOL CULTURE: E COLI SHIGA TOXIN ASSAY: NEGATIVE

## 2017-12-20 LAB — STOOL CULTURE REFLEX - CMPCXR

## 2017-12-20 LAB — STOOL CULTURE REFLEX - RSASHR

## 2017-12-21 LAB — OVA + PARASITE EXAM

## 2017-12-21 LAB — O&P RESULT

## 2018-01-04 ENCOUNTER — Other Ambulatory Visit (INDEPENDENT_AMBULATORY_CARE_PROVIDER_SITE_OTHER): Payer: Managed Care, Other (non HMO)

## 2018-01-04 ENCOUNTER — Encounter: Payer: Self-pay | Admitting: Gastroenterology

## 2018-01-04 ENCOUNTER — Ambulatory Visit (INDEPENDENT_AMBULATORY_CARE_PROVIDER_SITE_OTHER): Payer: Managed Care, Other (non HMO) | Admitting: Gastroenterology

## 2018-01-04 VITALS — BP 110/68 | HR 76 | Ht 64.5 in | Wt 136.2 lb

## 2018-01-04 DIAGNOSIS — R634 Abnormal weight loss: Secondary | ICD-10-CM

## 2018-01-04 DIAGNOSIS — R1011 Right upper quadrant pain: Secondary | ICD-10-CM | POA: Diagnosis not present

## 2018-01-04 DIAGNOSIS — Z8719 Personal history of other diseases of the digestive system: Secondary | ICD-10-CM | POA: Diagnosis not present

## 2018-01-04 LAB — CBC WITH DIFFERENTIAL/PLATELET
BASOS PCT: 0.6 % (ref 0.0–3.0)
Basophils Absolute: 0 10*3/uL (ref 0.0–0.1)
EOS PCT: 2.3 % (ref 0.0–5.0)
Eosinophils Absolute: 0.2 10*3/uL (ref 0.0–0.7)
HCT: 31.5 % — ABNORMAL LOW (ref 36.0–46.0)
HEMOGLOBIN: 10.4 g/dL — AB (ref 12.0–15.0)
LYMPHS ABS: 2.1 10*3/uL (ref 0.7–4.0)
Lymphocytes Relative: 27.3 % (ref 12.0–46.0)
MCHC: 33 g/dL (ref 30.0–36.0)
MCV: 82.8 fl (ref 78.0–100.0)
MONO ABS: 0.6 10*3/uL (ref 0.1–1.0)
MONOS PCT: 7.3 % (ref 3.0–12.0)
NEUTROS PCT: 62.5 % (ref 43.0–77.0)
Neutro Abs: 4.8 10*3/uL (ref 1.4–7.7)
Platelets: 371 10*3/uL (ref 150.0–400.0)
RBC: 3.8 Mil/uL — ABNORMAL LOW (ref 3.87–5.11)
RDW: 16.5 % — AB (ref 11.5–15.5)
WBC: 7.7 10*3/uL (ref 4.0–10.5)

## 2018-01-04 LAB — COMPREHENSIVE METABOLIC PANEL
ALT: 11 U/L (ref 0–35)
AST: 10 U/L (ref 0–37)
Albumin: 3.7 g/dL (ref 3.5–5.2)
Alkaline Phosphatase: 62 U/L (ref 39–117)
BUN: 13 mg/dL (ref 6–23)
CHLORIDE: 98 meq/L (ref 96–112)
CO2: 28 mEq/L (ref 19–32)
Calcium: 8.8 mg/dL (ref 8.4–10.5)
Creatinine, Ser: 0.89 mg/dL (ref 0.40–1.20)
GFR: 69.51 mL/min (ref >60.00)
GLUCOSE: 87 mg/dL (ref 70–99)
POTASSIUM: 3.2 meq/L — AB (ref 3.5–5.1)
SODIUM: 133 meq/L — AB (ref 135–145)
Total Bilirubin: 0.2 mg/dL (ref 0.2–1.2)
Total Protein: 6.5 g/dL (ref 6.0–8.3)

## 2018-01-04 LAB — LIPASE: LIPASE: 64 U/L — AB (ref 11.0–59.0)

## 2018-01-04 MED ORDER — PREDNISONE 50 MG PO TABS: ORAL_TABLET | ORAL | 0 refills | Status: DC

## 2018-01-04 MED ORDER — DIPHENHYDRAMINE HCL 25 MG PO CAPS: ORAL_CAPSULE | ORAL | 0 refills | Status: DC

## 2018-01-04 NOTE — Patient Instructions (Signed)
If you are age 57 or older, your body mass index should be between 23-30. Your Body mass index is 23.03 kg/m. If this is out of the aforementioned range listed, please consider follow up with your Primary Care Provider.  If you are age 58 or younger, your body mass index should be between 19-25. Your Body mass index is 23.03 kg/m. If this is out of the aformentioned range listed, please consider follow up with your Primary Care Provider.   We have sent the following medications to your pharmacy for you to pick up at your convenience: Prednisone 50 mg Take one tablet by mouth 13 hours before CT. Take one tablet by mouth 7 hours before CT.  Take one tablet by mouth 1 hour before CT.   Benadryl 25 mg one hour before CT  Please stop at the lab before you leave the office today.    You have been scheduled for a CT scan of the abdomen and pelvis at [facility] are scheduled on 01/09/18 at [place] should arrive 15 minutes prior to your appointment time for registration. Please follow the written instructions below on the day of your exam:  WARNING: IF YOU ARE ALLERGIC TO IODINE/X-RAY DYE, PLEASE NOTIFY RADIOLOGY IMMEDIATELY AT [phone]! YOU WILL BE GIVEN A 13 HOUR PREMEDICATION PREP.  1) Do not eat or drink anything after 4am (4 hours prior to your test) 2) You have been given 2 bottles of oral contrast to drink. The solution may taste better if refrigerated, but do NOT add ice or any other liquid to this solution. Shake well before drinking.    Drink 1 bottle of contrast @ 6am (2 hours prior to your exam)  Drink 1 bottle of contrast @ 7am (1 hour prior to your exam)  You may take any medications as prescribed with a small amount of water except for the following: Metformin, Glucophage, Glucovance, Avandamet, Riomet, Fortamet, Actoplus Met, Janumet, Glumetza or Metaglip. The above medications must be held the day of the exam AND 48 hours after the exam.  The purpose of you  drinking the oral contrast is to aid in the visualization of your intestinal tract. The contrast solution may cause some diarrhea. Before your exam is started, you will be given a small amount of fluid to drink. Depending on your individual set of symptoms, you may also receive an intravenous injection of x-ray contrast/dye. Plan on being at [facility] for 30 minutes or longer, depending on the type of exam you are having performed.  This test typically takes 30-45 minutes to complete.  If you have any questions regarding your exam or if you need to reschedule, you may call the CT department at [phone] between the hours of 8:00 am and 5:00 pm, Monday-Friday.  ________________________________________________________________________   Thank you,  Dr. Jackquline Denmark

## 2018-01-04 NOTE — Progress Notes (Addendum)
IMPRESSION and PLAN:   #1. 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  Weight loss (lost 26lb over the last 2 months), neg EGD 11/15/2017. #3.  Has associated anxiety. #4.  Esophageal dysphagia (resolved) s/p negative barium swallow, status post EGD with esophageal dilatation 11/15/2017 #5. H/O recurrent pancreatitis - ? Dx- adm to [facility] 04/2016, neg WU. No ETOH #6. IBS with alternating diarrhea and constipation currently more diarrhea, negative stool studies.  PLAN: - Continue Omepazole 20mg  po qd. - CT abdomen and pelvis with p.o. and IV contrast after contrast desensitization. (Give prednisone 50 mg p.o. 13, 7 and 1 hour before the procedure and Benadryl 25 mg p.o. 1 hour before the CT) - Check CBC, CMP, lipase, celiac and TSH. - Stop all sodas. - Stop smoking. - FU in 12 weeks. - Continue Bentyl 10 mg p.o. 4 times daily. - Discussed extensively with the patient and patient's husband. Pt's husband did tell us that she has been drinking Pepsi all the time.  I have asked her to stop drinking all sodas. -If still with problems, would proceed with colonoscopy. Of note that the last colonoscopy was in [facility] per patient in 2014 - negative.U in 12 weeks - Stop  Addendum: Records came in.  Negative right upper quadrant ultrasound 12/24/2017 at [facility] except for fatty liver.  No gallstones.  Labs showed normal CMP except sodium 127, lipase upper limit of normal at 74, CBC showing hemoglobin to be 10.5 with normal MCV 84. Hb 11.9 (10/26/2017). Recheck CBC (as ordered today)  HPI:    Chief Complaint:   Amber Noble is a 57 y.o. female  S/p barium swallow followed by EGD with esophageal dilatation to [address] [place] Maloney 11/15/2017 with good relief of dysphagia. New complaints of diarrhea -softer bowel movements 2-3 times per day after eating.  No nocturnal symptoms. Neg stool studies Continues  to have chronic epigastric/right upper quadrant abdominal pain made worse after eating.    Her last colonoscopy in [facility] 2014.  Per patient, she was told that she does not need another colonoscopy for 10 years.  She was admitted to [facility] in November 2017 with presumptive diagnosis of acute pancreatitis.  Patient underwent CT scan of the abdomen and pelvis without contrast (since she was allergic to IV contrast) which showed focal panniculitis.  No pancreatitis.  Liver function tests were normal.  Her gallbladder was normal.  She does have history of alcohol use but none recently. Had negative urine tox screen for any cocaine use or marijuana use.   Past Medical History:  Diagnosis Date  . Abdominal pain, acute, right upper quadrant   . Acute anxiety   . Acute epigastric pain   . Bronchiolitis   . Depression   . Fatigue   . GERD (gastroesophageal reflux disease)   . Hypercholesteremia   . Hyperhidrosis   . Hyperlipidemia   . Hypertension   . IBS (irritable bowel syndrome)   . Insomnia   . Menopause   . Pancreatitis   . Weight loss, unintentional     Current Outpatient Medications  Medication Sig Dispense Refill  . ALPRAZolam (XANAX) 1 MG tablet Take 1 mg by mouth as needed.  1  . dicyclomine (BENTYL) 10 MG capsule Take 1 capsule (10 mg total) by mouth 2 times daily at 12 noon and 4 pm. 60 capsule 0  .  EPINEPHrine 0.3 mg/0.3 mL IJ SOAJ injection Inject 0.3 mg into the muscle once.    . hydrochlorothiazide (HYDRODIURIL) 25 MG tablet Take 25 mg by mouth daily.    Marland Kitchen. losartan (COZAAR) 25 MG tablet Take 50 mg by mouth daily.     . meclizine (ANTIVERT) 25 MG tablet Take 25 mg by mouth 3 (three) times daily as needed for dizziness.    Marland Kitchen. omeprazole (PRILOSEC) 20 MG capsule Take 20 mg by mouth 2 (two) times daily.    . ondansetron (ZOFRAN) 4 MG tablet Take 4 mg by mouth every 8 (eight) hours as needed for nausea or vomiting.    Marland Kitchen. PARoxetine (PAXIL) 40 MG tablet Take 40 mg by mouth at  bedtime.     Marland Kitchen. PROAIR HFA 108 (90 Base) MCG/ACT inhaler Take 1-2 puffs by mouth every 6 (six) hours as needed for shortness of breath.  1  . traZODone (DESYREL) 100 MG tablet Take 100 mg by mouth at bedtime as needed for sleep.  1   Current Facility-Administered Medications  Medication Dose Route Frequency Provider Last Rate Last Dose  . 0.9 %  sodium chloride infusion  500 mL Intravenous Once Lynann BolognaGupta, Azharia Surratt, MD        Past Surgical History:  Procedure Laterality Date  . BACK SURGERY  2012   disc removal  . BUNIONECTOMY Left 11/2016  . COLONOSCOPY  2014   Negative, no polyps. Rpt 2024  . LUMBAR FUSION  12/2011  . PARTIAL HYSTERECTOMY  2004  . PARTIAL HYSTERECTOMY  2003  . UPPER GI ENDOSCOPY  08/21/2014   Gastric Ulcer, MOderate gastritis. BX Reactive Gastropathy    Family History  Problem Relation Age of Onset  . Hypertension Mother   . Hypercholesterolemia Mother   . Prostate cancer Father   . Melanoma Sister   . Diabetes Sister   . Heart failure Maternal Grandmother   . CVA Maternal Grandmother   . Allergic rhinitis Neg Hx   . Angioedema Neg Hx   . Asthma Neg Hx   . Eczema Neg Hx   . Immunodeficiency Neg Hx   . Urticaria Neg Hx     Social History   Tobacco Use  . Smoking status: Current Every Day Smoker    Packs/day: 1.00    Types: Cigarettes  . Smokeless tobacco: Never Used  Substance Use Topics  . Alcohol use: Not Currently    Comment: states she quit one month ago  . Drug use: No    Allergies  Allergen Reactions  . Contrast Media [Iodinated Diagnostic Agents] Hives, Swelling and Other (See Comments)    Other reaction(s): HIVES, ITCHING Pt was pre-medicated prior to last imaging and still had HIVES/ITCHING afterward Throat  13 hour prep  . Ioxaglate Swelling and Hives    Throat  13 hour prep  . Vicodin [Hydrocodone-Acetaminophen] Itching  . Iohexol Rash    Desc: PREMED BENADRYL Desc: PREMED BENADRYL      Review of Systems: All systems reviewed  and negative except where noted in HPI.    Physical Exam:     BP 110/68   Pulse 76   Ht 5' 4.5" (1.638 m)   Wt 136 lb 4 oz (61.8 kg)   LMP 07/01/2011   BMI 23.03 kg/m  Filed Weights   01/04/18 0902  Weight: 136 lb 4 oz (61.8 kg)   Weight was 143 pounds on 10/26/2017 GENERAL:  Alert, oriented, cooperative, not in acute distress. PSYCH: :Pleasant, normal mood and affect. HEENT:  conjunctiva pink, mucous membranes moist, neck supple without masses. No jaundice. CARDIAC:  S1 S2 normal. No murmers. PULM: Normal respiratory effort, lungs CTA bilaterally, no wheezing. ABDOMEN: Inspection: No visible peristalsis, no abnormal pulsations, skin normal.  Palpation/percussion: Soft, mild epigastric and right upper quadrant abdominal tenderness, nondistended, no rigidity, no abnormal dullness to percussion, no hepatosplenomegaly and no palpable abdominal masses.  Auscultation: Normal bowel sounds, no abdominal bruits. Rectal exam: Deferred SKIN:  turgor, no lesions seen. Musculoskeletal:  Normal muscle tone, normal strength. NEURO: Alert and oriented x 3, no focal neurologic deficits.  Seen in presence of patient's husband.   Salathiel Ferrara,MD 01/04/2018, 9:23 AM   [facility]*

## 2018-01-07 ENCOUNTER — Other Ambulatory Visit: Payer: Self-pay

## 2018-01-07 DIAGNOSIS — D508 Other iron deficiency anemias: Secondary | ICD-10-CM

## 2018-01-07 MED ORDER — POTASSIUM CHLORIDE ER 10 MEQ PO TBCR: EXTENDED_RELEASE_TABLET | ORAL | 0 refills | Status: DC

## 2018-01-09 ENCOUNTER — Ambulatory Visit (HOSPITAL_BASED_OUTPATIENT_CLINIC_OR_DEPARTMENT_OTHER)
Admission: RE | Admit: 2018-01-09 | Discharge: 2018-01-09 | Disposition: A | Discharge status: Home / Self Care | Payer: Managed Care, Other (non HMO) | Source: Ambulatory Visit | Attending: Gastroenterology | Admitting: Gastroenterology

## 2018-01-09 ENCOUNTER — Telehealth: Payer: Self-pay | Admitting: Gastroenterology

## 2018-01-09 ENCOUNTER — Other Ambulatory Visit: Payer: Self-pay | Admitting: Gastroenterology

## 2018-01-09 DIAGNOSIS — I700 Atherosclerosis of aorta: Secondary | ICD-10-CM | POA: Diagnosis not present

## 2018-01-09 DIAGNOSIS — R634 Abnormal weight loss: Secondary | ICD-10-CM

## 2018-01-09 DIAGNOSIS — Z6823 Body mass index (BMI) 23.0-23.9, adult: Secondary | ICD-10-CM | POA: Diagnosis not present

## 2018-01-09 DIAGNOSIS — R1011 Right upper quadrant pain: Secondary | ICD-10-CM | POA: Diagnosis present

## 2018-01-09 DIAGNOSIS — M4854XA Collapsed vertebra, not elsewhere classified, thoracic region, initial encounter for fracture: Secondary | ICD-10-CM | POA: Diagnosis not present

## 2018-01-09 DIAGNOSIS — Z8719 Personal history of other diseases of the digestive system: Secondary | ICD-10-CM | POA: Insufficient documentation

## 2018-01-09 NOTE — Telephone Encounter (Signed)
She is aware of the results.

## 2018-01-23 ENCOUNTER — Telehealth: Payer: Self-pay | Admitting: Gastroenterology

## 2018-01-23 ENCOUNTER — Telehealth: Payer: Self-pay

## 2018-01-23 NOTE — Telephone Encounter (Signed)
I spoke with her and let her know that we had not received the lab work.  She is going to call [facility] and have them fax the labs to me.

## 2018-01-23 NOTE — Telephone Encounter (Signed)
I did receive her lab work from [facility] and it will be faxed into the chart.  FYI, she did have one out of three positive on her hemocult card.

## 2018-01-24 ENCOUNTER — Telehealth: Payer: Self-pay | Admitting: Gastroenterology

## 2018-01-24 NOTE — Telephone Encounter (Signed)
I spoke with her and let her know that I was still waiting to hear back from Dr Chales AbrahamsGupta.

## 2018-01-25 ENCOUNTER — Telehealth: Payer: Self-pay | Admitting: Gastroenterology

## 2018-01-25 NOTE — Telephone Encounter (Signed)
I spoke with her and let her know that I had not heard back from Dr Chales AbrahamsGupta yet.  I will call her with his response as soon as I receive it.

## 2018-01-25 NOTE — Telephone Encounter (Signed)
Patient has called a second time and is wondering what the plan is for the positive hemocult?  Please advise.  Thank you.

## 2018-01-28 NOTE — Telephone Encounter (Signed)
Patient wanting to know results and states they should be back by now.

## 2018-01-28 NOTE — Telephone Encounter (Signed)
I let her know that I still did not have an answer concerning the 1 heme positive stool sample.

## 2018-01-29 NOTE — Telephone Encounter (Signed)
Had heme positive stools EGD report reviewed Plan: Proceed with colonoscopy with clenpiq Please see comment/plan as per last clinic note.

## 2018-01-29 NOTE — Telephone Encounter (Signed)
I spoke with the patient and she is scheduled for pre-visit 02-07-18 at 9:00 am and colon 02-19-18 at 8:30 am.

## 2018-02-07 ENCOUNTER — Encounter: Payer: Self-pay | Admitting: Gastroenterology

## 2018-02-07 ENCOUNTER — Other Ambulatory Visit: Payer: Self-pay

## 2018-02-07 ENCOUNTER — Ambulatory Visit (AMBULATORY_SURGERY_CENTER): Payer: Self-pay | Admitting: *Deleted

## 2018-02-07 VITALS — Ht 62.5 in | Wt 133.8 lb

## 2018-02-07 DIAGNOSIS — R195 Other fecal abnormalities: Secondary | ICD-10-CM

## 2018-02-07 MED ORDER — SOD PICOSULFATE-MAG OX-CIT ACD 10-3.5-12 MG-GM -GM/160ML PO SOLN: 1.0000 | Freq: Once | ORAL | 0 refills | Status: AC

## 2018-02-07 NOTE — Progress Notes (Signed)
Patient denies any allergies to egg or soy products. Patient denies complications with anesthesia/sedation. Patient denies oxygen use at home and denies diet medications. Information on colonoscopy denied.

## 2018-02-19 ENCOUNTER — Ambulatory Visit (AMBULATORY_SURGERY_CENTER): Payer: Managed Care, Other (non HMO) | Admitting: Gastroenterology

## 2018-02-19 ENCOUNTER — Encounter: Payer: Self-pay | Admitting: Gastroenterology

## 2018-02-19 VITALS — BP 127/78 | HR 62 | Temp 98.4°F | Resp 13 | Ht 62.5 in | Wt 133.0 lb

## 2018-02-19 DIAGNOSIS — R195 Other fecal abnormalities: Secondary | ICD-10-CM

## 2018-02-19 MED ORDER — SODIUM CHLORIDE 0.9 % IV SOLN: 500.0000 mL | Freq: Once | INTRAVENOUS | Status: DC

## 2018-02-19 NOTE — Progress Notes (Signed)
To PACU, vss patent aw report to rn 

## 2018-02-19 NOTE — Progress Notes (Signed)
Pt's states no medical or surgical changes since previsit or office visit. 

## 2018-02-19 NOTE — Patient Instructions (Signed)
Discharge instructions given. Handouts on diverticulosis and hemorrhoids. Return office visit in 12 weeks. Office will call to schedule. Resume previous medications. YOU HAD AN ENDOSCOPIC PROCEDURE TODAY AT THE [facility]:   Refer to the procedure report that was given to you for any specific questions about what was found during the examination.  If the procedure report does not answer your questions, please call your gastroenterologist to clarify.  If you requested that your care partner not be given the details of your procedure findings, then the procedure report has been included in a sealed envelope for you to review at your convenience later.  YOU SHOULD EXPECT: Some feelings of bloating in the abdomen. Passage of more gas than usual.  Walking can help get rid of the air that was put into your GI tract during the procedure and reduce the bloating. If you had a lower endoscopy (such as a colonoscopy or flexible sigmoidoscopy) you may notice spotting of blood in your stool or on the toilet paper. If you underwent a bowel prep for your procedure, you may not have a normal bowel movement for a few days.  Please Note:  You might notice some irritation and congestion in your nose or some drainage.  This is from the oxygen used during your procedure.  There is no need for concern and it should clear up in a day or so.  SYMPTOMS TO REPORT IMMEDIATELY:   Following lower endoscopy (colonoscopy or flexible sigmoidoscopy):  Excessive amounts of blood in the stool  Significant tenderness or worsening of abdominal pains  Swelling of the abdomen that is new, acute  Fever of 100F or higher  For urgent or emergent issues, a gastroenterologist can be reached at any hour by calling [phone].   DIET:  We do recommend a small meal at first, but then you may proceed to your regular diet.  Drink plenty of fluids but you should avoid alcoholic beverages for 24 hours.  ACTIVITY:  You  should plan to take it easy for the rest of today and you should NOT DRIVE or use heavy machinery until tomorrow (because of the sedation medicines used during the test).    FOLLOW UP: Our staff will call the number listed on your records the next business day following your procedure to check on you and address any questions or concerns that you may have regarding the information given to you following your procedure. If we do not reach you, we will leave a message.  However, if you are feeling well and you are not experiencing any problems, there is no need to return our call.  We will assume that you have returned to your regular daily activities without incident.  If any biopsies were taken you will be contacted by phone or by letter within the next 1-3 weeks.  Please call us at [phone] if you have not heard about the biopsies in 3 weeks.    SIGNATURES/CONFIDENTIALITY: You and/or your care partner have signed paperwork which will be entered into your electronic medical record.  These signatures attest to the fact that that the information above on your After Visit Summary has been reviewed and is understood.  Full responsibility of the confidentiality of this discharge information lies with you and/or your care-partner.

## 2018-02-19 NOTE — Op Note (Addendum)
[facility] Patient Name: Amber Noble Procedure Date: 02/19/2018 8:30 AM MRN: [id number] Endoscopist: Lynann Bologna , MD Age: 57 Referring MD:  Date of Birth: [birth year] Gender: Female Account #: [id number] Procedure:                Colonoscopy Indications:              Heme positive stool Medicines:                Monitored Anesthesia Care Procedure:                Pre-Anesthesia Assessment:                           - Prior to the procedure, a History and Physical                            was performed, and patient medications and                            allergies were reviewed. The patient's tolerance of                            previous anesthesia was also reviewed. The risks                            and benefits of the procedure and the sedation                            options and risks were discussed with the patient.                            All questions were answered, and informed consent                            was obtained. Prior Anticoagulants: The patient has                            taken no previous anticoagulant or antiplatelet                            agents. ASA Grade Assessment: III - A patient with                            severe systemic disease. After reviewing the risks                            and benefits, the patient was deemed in                            satisfactory condition to undergo the procedure.                           After obtaining informed consent, the colonoscope  was passed under direct vision. Throughout the                            procedure, the patient's blood pressure, pulse, and                            oxygen saturations were monitored continuously. The                            Model PCF-[id number]) scope was introduced                            through the anus and advanced to the the cecum,                            identified by appendiceal orifice and  ileocecal                            valve. The colonoscopy was somewhat difficult due                            to poor bowel prep. Successful completion of the                            procedure was aided by lavage. The patient                            tolerated the procedure well. The quality of the                            bowel preparation was fair. Approximately 75-80% of                            the colonic mucosa was visualized satisfactorily.                            Of note the small and flat lesions could have been                            missed ue to quality of preparation. Scope In: 8:37:19 AM Scope Out: 8:53:18 AM Scope Withdrawal Time: 0 hours 9 minutes 10 seconds  Total Procedure Duration: 0 hours 15 minutes 59 seconds  Findings:                 A few rare small-mouthed diverticula were found in                            the sigmoid colon.                           Non-bleeding internal hemorrhoids were found during                            retroflexion. The hemorrhoids were small.  The exam was otherwise without abnormality on                            direct and retroflexion views. Complications:            No immediate complications. Estimated Blood Loss:     Estimated blood loss: none. Impression:               - Preparation of the colon was fair.                           - Very mild sigmoid diverticulosis.                           - Non-bleeding internal hemorrhoids.                           - The examination was otherwise normal on direct                            and retroflexion views. Recommendation:           - Patient has a contact number available for                            emergencies. The signs and symptoms of potential                            delayed complications were discussed with the                            patient. Return to normal activities tomorrow.                            Written  discharge instructions were provided to the                            patient.                           - Resume previous diet.                           - Continue present medications.                           - Return to GI clinic in 12 weeks. At FU, re-check                            Hemoccult cards, CBC and further recommendations. Lynann Bologna, MD 02/19/2018 8:59:18 AM This report has been signed electronically.

## 2018-02-20 ENCOUNTER — Telehealth: Payer: Self-pay

## 2018-02-20 NOTE — Telephone Encounter (Signed)
  Follow up Call-  Call Kina Shiffman number 02/19/2018 11/15/2017  Post procedure Call Retta Pitcher phone  # [phone]  Permission to leave phone message Yes Yes  Some recent data might be hidden     Patient questions:  Do you have a fever, pain , or abdominal swelling? No. Pain Score  0 *  Have you tolerated food without any problems? Yes.    Have you been able to return to your normal activities? Yes.    Do you have any questions about your discharge instructions: Diet   No. Medications  No. Follow up visit  No.  Do you have questions or concerns about your Care? No.  Actions: * If pain score is 4 or above: No action needed, pain <4.

## 2018-09-19 ENCOUNTER — Ambulatory Visit: Payer: Managed Care, Other (non HMO) | Admitting: Family Medicine

## 2018-10-23 ENCOUNTER — Ambulatory Visit: Payer: Managed Care, Other (non HMO) | Admitting: Family Medicine

## 2019-07-21 IMAGING — RF DG ESOPHAGUS
6 series · 20 of 21 positions shown · non-contrast
Comparison: Report from esophagram 10/11/2005

CLINICAL DATA: Esophageal dysphagia. Epigastric pain with weight
loss

EXAM:
ESOPHOGRAM / BARIUM SWALLOW / BARIUM TABLET STUDY
TECHNIQUE: Combined double contrast and single contrast examination performed
using effervescent crystals, thick barium liquid, and thin barium
liquid. The patient was observed with fluoroscopy swallowing a 13 mm
barium sulphate tablet.
FLUOROSCOPY TIME:  Fluoroscopy Time:  1 minutes
Radiation Exposure Index (if provided by the fluoroscopic device): 3
mGy
Number of Acquired Spot Images: 0

[Series 1: cp_standard · 0.51mm/px · 4 of 41 frames shown (1 of 6)]
[frame 7/41]
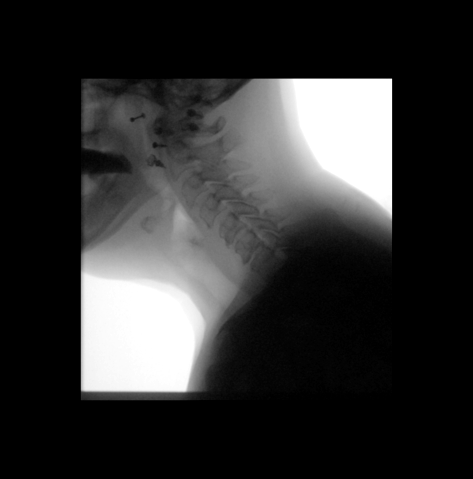
[frame 13/41]
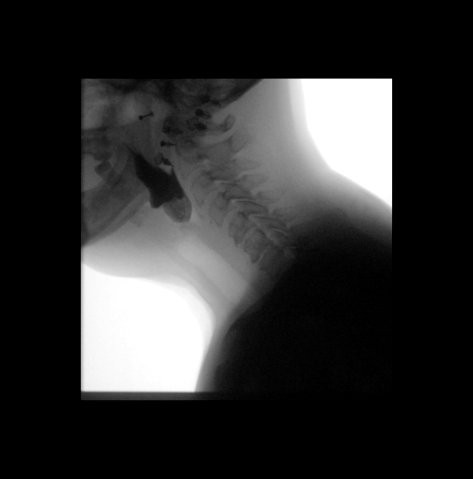
[frame 21/41]
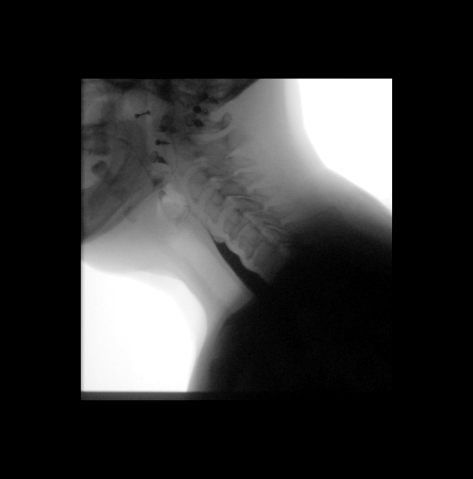
[frame 35/41]
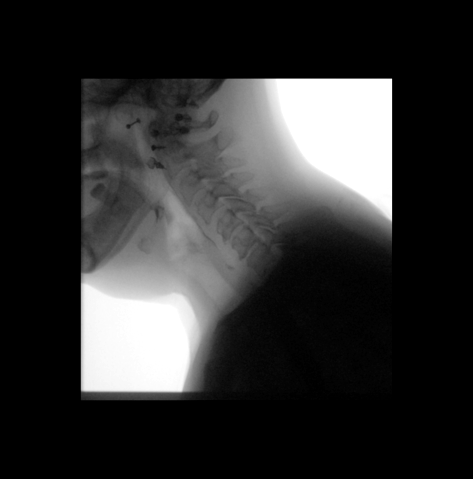

[Series 2: cp_standard · 0.52mm/px · 4 of 30 frames shown (2 of 6)]
[frame 1/30]
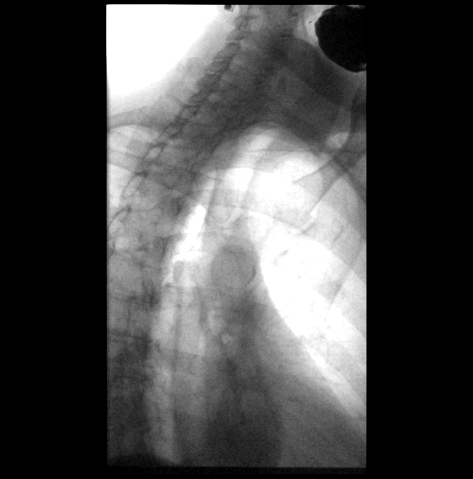
[frame 5/30]
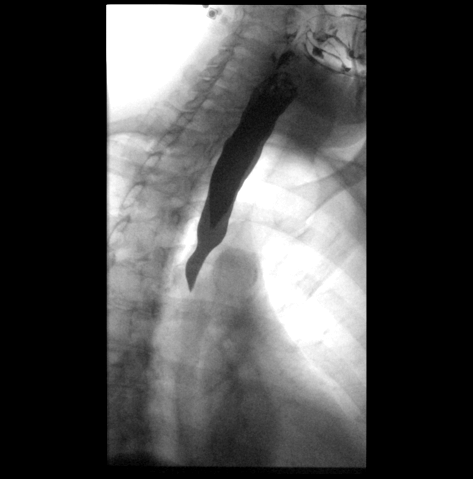
[frame 16/30]
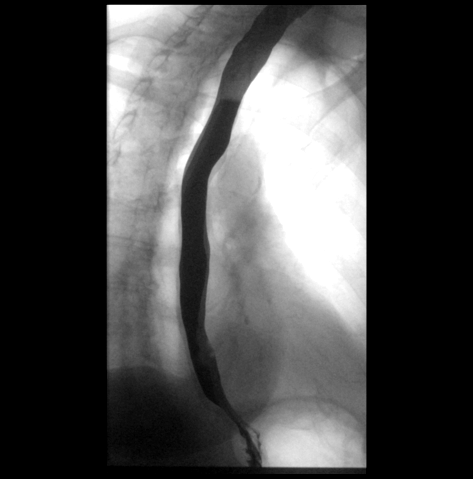
[frame 26/30]
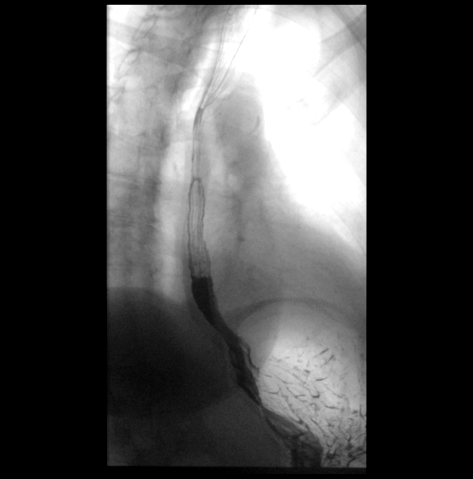

[Series 3: cp_standard · 0.52mm/px · 3 of 25 frames shown (3 of 6)]
[frame 4/25]
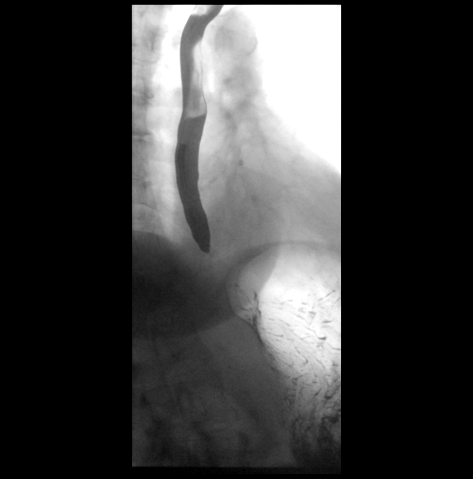
[frame 13/25]
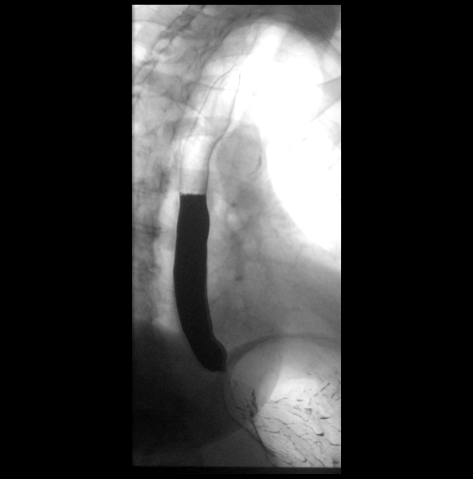
[frame 23/25]
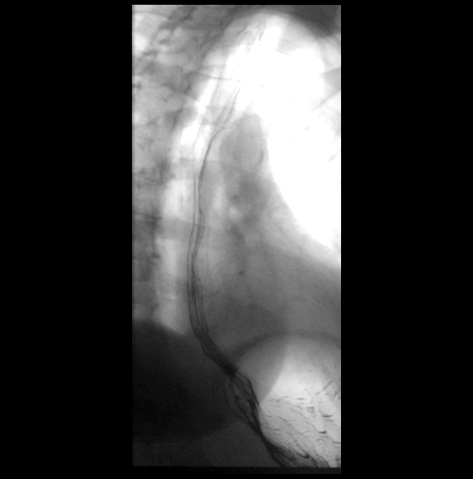

[Series 4: cp_standard · 0.52mm/px · 4 of 24 frames shown (4 of 6)]
[frame 4/24]
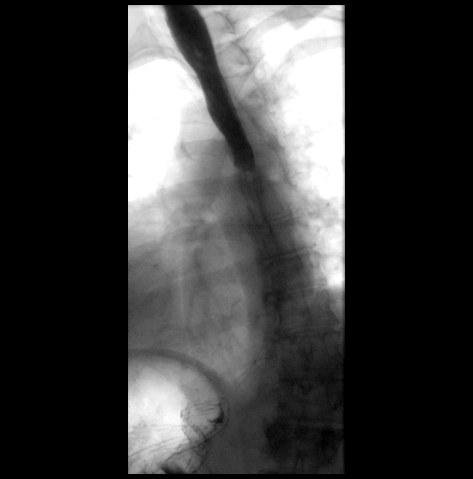
[frame 13/24]
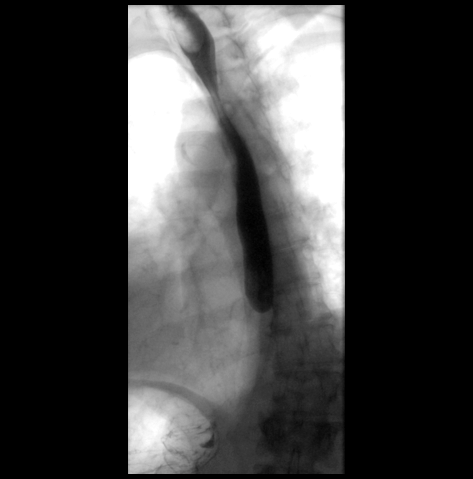
[frame 21/24]
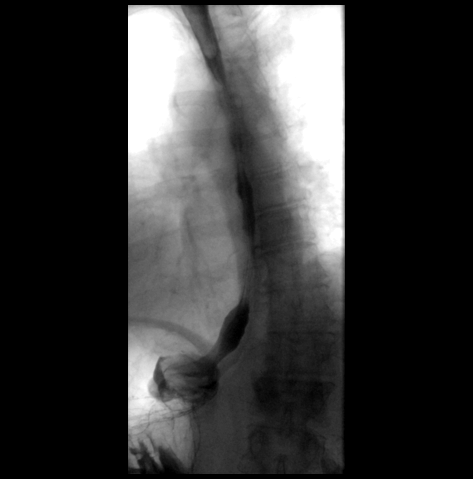
[frame 22/24]
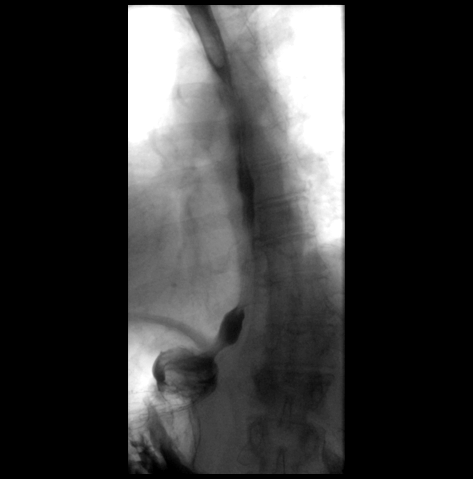

[Series 5: cp_standard · 0.52mm/px · 4 of 18 frames shown (5 of 6)]
[frame 3/18]
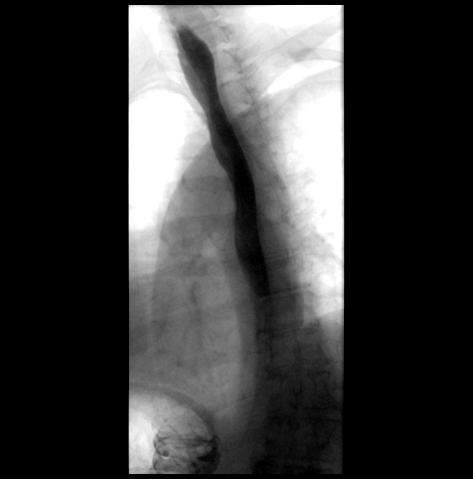
[frame 10/18]
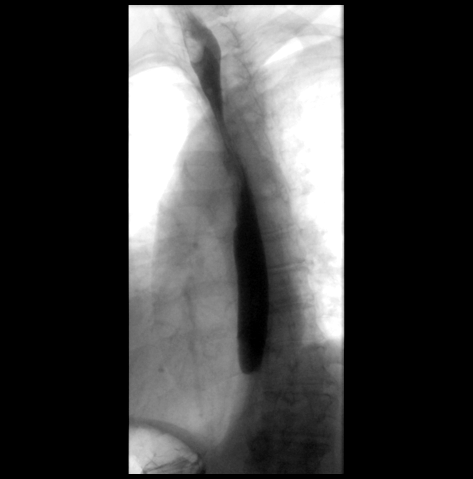
[frame 16/18]
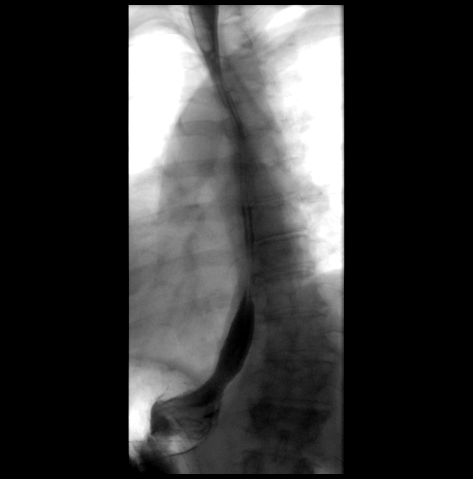
[frame 18/18]
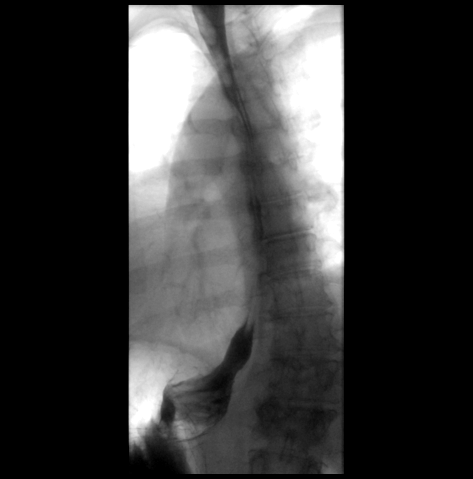

[Series 6: cp_standard · 0.26mm/px · 1 of 1 slices shown (6 of 6)]
[im 1/1]
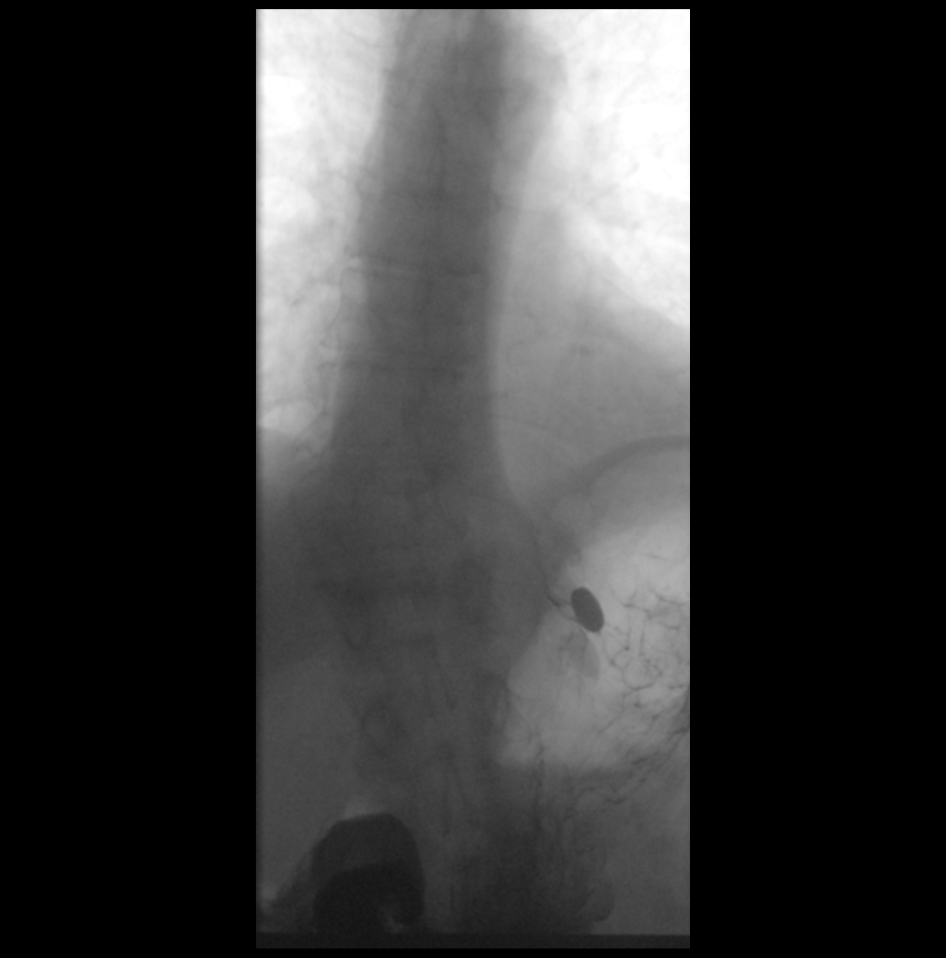

[20 of 21 positions shown; findings below may reference images not displayed]

FINDINGS: Lateral pharyngeal imaging is normal. There is good epiglottis
turnover and airway protection. No obstructive process or
diverticulum.

The esophagus has normal distensibility and diffusely smooth mucosal
contour. Motility was normal in the recumbent RAO position. Negative
for hiatal hernia. No encountered reflux. A 13 mm barium tablet
easily reach the stomach.
IMPRESSION: Normal esophagram.

## 2023-10-05 ENCOUNTER — Other Ambulatory Visit: Payer: Self-pay | Admitting: Obstetrics and Gynecology

## 2023-10-05 DIAGNOSIS — N644 Mastodynia: Secondary | ICD-10-CM

## 2023-10-29 ENCOUNTER — Encounter (HOSPITAL_COMMUNITY): Payer: Self-pay

## 2023-11-05 ENCOUNTER — Other Ambulatory Visit

## 2023-11-05 ENCOUNTER — Encounter
